# Patient Record
Sex: Male | Born: 1960 | Race: White | Hispanic: Yes | Smoking: Never smoker
Health system: Southern US, Community
[De-identification: ages and names within clinical notes are randomized; demographics above are authoritative.]

## PROBLEM LIST (undated history)

## (undated) DIAGNOSIS — F1010 Alcohol abuse, uncomplicated: Secondary | ICD-10-CM

---

## 2001-06-23 ENCOUNTER — Emergency Department (HOSPITAL_COMMUNITY): Admission: EM | Admit: 2001-06-23 | Discharge: 2001-06-23 | Payer: Self-pay | Admitting: Emergency Medicine

## 2003-09-15 ENCOUNTER — Inpatient Hospital Stay (HOSPITAL_COMMUNITY): Admission: EM | Admit: 2003-09-15 | Discharge: 2003-09-19 | Payer: Self-pay | Admitting: Psychiatry

## 2004-08-31 ENCOUNTER — Inpatient Hospital Stay (HOSPITAL_COMMUNITY): Admission: EM | Admit: 2004-08-31 | Discharge: 2004-09-02 | Payer: Self-pay | Admitting: Emergency Medicine

## 2004-08-31 ENCOUNTER — Ambulatory Visit: Payer: Self-pay | Admitting: Internal Medicine

## 2004-09-02 ENCOUNTER — Emergency Department (HOSPITAL_COMMUNITY): Admission: EM | Admit: 2004-09-02 | Discharge: 2004-09-03 | Payer: Self-pay | Admitting: Emergency Medicine

## 2006-12-28 ENCOUNTER — Emergency Department (HOSPITAL_COMMUNITY): Admission: EM | Admit: 2006-12-28 | Discharge: 2006-12-28 | Payer: Self-pay | Admitting: Emergency Medicine

## 2010-06-18 NOTE — Discharge Summary (Signed)
NAME:  Ricardo Koch, Ricardo Koch NO.:  [id number]   MEDICAL RECORD NO.:  [id number]          PATIENT TYPE:  INP   LOCATION:  3030                         FACILITY:  [facility]   PHYSICIAN:  Alvester Morin, M.D.  DATE OF BIRTH:  [birth year]   DATE OF ADMISSION:  08/31/2004  DATE OF DISCHARGE:  09/02/2004                                 DISCHARGE SUMMARY   DISCHARGE DIAGNOSES:  1.  Mallory-Weiss tear, status post esophagogastroduodenoscopy with      epinephrine injection.  2.  Thrombocytopenia.  3.  Hypokalemia.  4.  Hyponatremia.  5.  Alcohol abuse.  6.  Acute renal insufficiency.  7.  Hyperglycemia.   DISCHARGE MEDICATIONS:  1.  Protonix 40 mg b.i.d.  2.  Multivitamin one daily.   CONDITION ON DISCHARGE:  The patient has significantly improved without any  signs of active bleeding, hematemesis, or melena with stable CBC. The  patient is also symptomatically feeling much better.  The patient is to  follow up with Dr. Merlene Pulling on September 09, 2004, for followup CBC and fasting  glucose. The patient was advised to quit drinking. He is given an  appointment to [facility] with a Spanish speaking group. He is  advised that if he continues to have drink he may have a recurrence of  hematemesis with grave consequences. All discharge instructions and followup  were reviewed with the translator present.   PROCEDURE:  1.  On August 31, 2004, abdominal x-ra. Impression: Three large gallstones,      degenerative changes in left hip,  2.  EGD, on August 31, 2004, showed Mallory-Weiss tear at the      gastroesophageal junction with injection of epinephrine. No  peptic      ulcer disease or esophageal varices.   CONSULTATIONS:  GI consult: Dr. Herbert Moors.   ADMITTING HISTORY & PHYSICAL:  The patient is a 50 year old Hispanic male  with a history of chronic alcohol abuse, who presented to the emergency  department after approximately 15 hours of nausea and hematemesis. The  patient states that at 2 p.m. the day prior to admission he experienced  sudden onset of nausea and blood tinged emesis. Vomiting continued overnight  approximately six times with progressively larger amounts of blood. The  patient states that prior to this episode he had been well without  complaints. Subsequent to the episode he noticed the presence of tarry  stools. He denies abdominal pain, diarrhea, or constipation. The patient had  a similar episode approximately 11 years ago, but was  unaware of the  diagnosis and was unclear about what interventions were performed. He has an  extensive alcohol abuse history. He drinks approximately 10 drinks per day.  Vital signs on admission revealed temperature of 100.4, blood pressure  123/78, heart rate 136, respiratory rate 22, pulse oximetry 100% on room  air.  Orthostatics were followed: lying blood pressure 114/56 and heart rate  95; sitting blood pressure 124/53 with heart rate 99; standing blood  pressure 125/251 with heart rate 110.   PHYSICAL EXAMINATION:  GENERAL: Alert, in no acute distress.  PULMONARY: Breath sounds clear to auscultation bilaterally.  HEENT: PERRL. EOMI. Nonicteric. No oropharyngeal injection. No exudate. No  lymphadenopathy.  CV: S1 and S2 without murmurs, thrills, rubs, or gallops.  ABDOMEN: Soft, nontender, and nondistended. No masses. No  hepatosplenomegaly. Positive bowel sounds times four. No ascites.  EXTREMITIES: 2+ radial pulses bilaterally; 2+ dorsalis pedis pulses  bilaterally. No clubbing, cyanosis, or edema.  SKIN: No jaundice.  No varicosities.  PSYCHIATRIC: Euthymic with congruent affect.   PERTINENT LABORATORIES ON ADMISSION:  Lipase 26, alcohol less than 5.  White  count 5.9, hemoglobin 13.1, platelet count 113,000.  PT 18, PTT 18, INR 1.5.  Sodium 130, potassium 3.0, chloride 93, bicarbonate 28, BUN 18, creatinine  1.3, glucose 186. Anion gap 9. Bilirubin 2. Alkaline phosphatase 70, AST  167,  ALT 101, albumin 2.9. EGD revealed a bleeding Mallory-Weiss tear.   HOSPITAL COURSE:   PROBLEM #1:  Hematemesis.  The patient presented with bleeding Mallory-Weiss  tear which via EGD was injected with epinephrine. The patient had no  vomiting of blood in the hospital. He was transfused one unit of packed red  blood cells in the emergency department. The patient was subsequently  transfused two additional units while in stepdown which elevated his CBC  from hemoglobin of 7.7 to 9.3 (admission hemoglobin was 9.0). The patient's  hemoglobin subsequently remained stable during his hospital stay with no  evidence of additional bleeding by stool or emesis.   PROBLEM #2:  Hypokalemia/hyponatremia. Electrolyte abnormalities were  secondary to vomiting, dehydration, and alcohol abuse. The patient was  rehydrated with normal saline. Potassium was repleted with potassium  chloride elixir and subsequently remained stable throughout his hospital  stay.   PROBLEM #3:  Thrombocytopenia. The patient's platelets on admission were 83  and subsequently fell to 44.  He was transfused two units of FFP. The  patient's thrombocytopenia is believed to be secondary to alcohol abuse and  vitamin B12 and folate deficiency.   PROBLEM #4:  Alcohol abuse. The patient was advised several times of the  importance of drinking sensation. The patient verbalizes understanding and  has agreed to go to [employer] which we have provided with the  Spanish meeting group with instructions in Spanish with the meeting place  and time.   PROBLEM #5:  Hyperglycemia. The patient's blood glucose was elevated during  his hospital stay with fasting blood glucose ranging from 121 to 186. The  patient's hemoglobin A1C came back to 6.0. We will obtain one additional  blood sugar during his follow-up appointment at clinic and we will start a diet regimen and possible medical management as well as arrange long-term  diabetic  follow-up.   PROBLEM #6:  Acute renal insufficiency. The patient's acute renal  insufficiency is most likely due to his dehydration. Serum creatinine was  corrected following progressive rehydration.   DISCHARGE LABORATORIES AND VITAL SIGNS:  The patient's last set of labs on  the day of discharge were as follows: Sodium 138, potassium 3.4, chloride  106, CO2 26, BUN 5, creatinine 1, glucose 121, calcium 7.4. CBC showed a  white count of 5.4, hemoglobin 9.1, platelet count 57,000, MCV 95.3, and no  pending labs.      Wil   WC/MEDQ  D:  09/04/2004  T:  09/05/2004  Job:  [id number]   cc:   Graylin Shiver, M.D.  [address]  [address]  [place], [place] [address]  Fax: [phone]

## 2010-06-18 NOTE — Op Note (Signed)
NAME:  Ricardo Koch, Ricardo Koch NO.:  [id number]   MEDICAL RECORD NO.:  [id number]          PATIENT TYPE:  EMS   LOCATION:  MAJO                         FACILITY:  [facility]   PHYSICIAN:  Graylin Shiver, M.D.   DATE OF BIRTH:  May 13, 1960   DATE OF PROCEDURE:  08/31/2004  DATE OF DISCHARGE:                                 OPERATIVE REPORT   Esophagogastroduodenoscopy with injection of a bleeding MalloryWeiss tear.   INDICATIONS:  Upper GI bleed.   Informed consent was obtained after explanation of the risks of bleeding,  infection and perforation.   PREMEDICATION:  Fentanyl 100 mcg IV, Versed 10 milligrams IV, Phenergan 12.5  milligrams IV.   PROCEDURE:  With the patient in the left lateral decubitus position, the  Olympus gastroscope was inserted into the oropharynx and passed into the  esophagus. It was advanced down the esophagus and into the stomach and into  the duodenum. The second portion and bulb of the duodenum looked normal. The  stomach did not reveal any lesions in the antrum or body.  There was some  blood clot noted in the body of the stomach.  The scope was retroflexed. No  specific lesions were seen in the fundus or cardia.  I could see some blood  dripping from that area.  The scope was then straightened and brought back.  There was a Mallory Weiss tear noted just distal to the EG junction. This  area was oozing blood. It was injected with 3 cc of 1:10,000 epinephrine  solution.  The esophagus did not reveal any esophageal varices. The patient  was a very uncooperative during the exam, was fighting with [place] which made  the exam very difficult due to his uncooperative nature.   IMPRESSION:  Bleeding Mallory Weiss tear injected with 3 cc of 1:10,000  epinephrine.   PLAN:  I will give the patient vitamin K, fresh frozen plasma. His H&H's  will be followed. He will be on Protonix 40 milligrams IV q.12 hours.       SFG/MEDQ  D:  08/31/2004  T:  09/01/2004   Job:  [id number]   cc:   Alvester Morin, M.D.  [address]  [place]  [place] [address]  Fax: [phone]

## 2010-06-18 NOTE — Consult Note (Signed)
NAME:  Ricardo Koch, Ricardo Koch NO.:  [id number]   MEDICAL RECORD NO.:  [id number]          PATIENT TYPE:  EMS   LOCATION:  MAJO                         FACILITY:  [facility]   PHYSICIAN:  Graylin Shiver, M.D.   DATE OF BIRTH:  Nov 28, 1960   DATE OF CONSULTATION:  08/31/2004  DATE OF DISCHARGE:                                   CONSULTATION   REASON FOR CONSULTATION:  The patient is a 50 year old [place] male who  presented to the emergency room with a history of vomiting and hematemesis.  He started vomiting yesterday of at 2:00 p.m. and continued to vomit  throughout the night with progressively more hematemesis. He is not  complaining of abdominal pain or chest pain. He does have a history of  drinking excessive alcohol, usually drinks six or seven beers a day during  the week and more on the weekend. He does not take aspirin or nonsteroidal  anti-inflammatory drugs. He did have a dark stool yesterday.   PAST HISTORY:   MEDICAL PROBLEMS:  None stated.   ALLERGIES:  None known.   MEDICATIONS:  None.   PAST SURGICAL HISTORY:  None   SOCIAL HISTORY:  Does not smoke. Drinks alcohol as stated above.   REVIEW OF SYSTEMS:  No complaints of anginal chest pain, shortness of  breath, cough or sputum production,   PHYSICAL EXAMINATION:  GENERAL:  He does not appear any acute distress.  VITAL SIGNS:  Blood pressure is 123/78, pulse 136.  HEENT:  He is nonicteric. He is alert and oriented.  NECK:  Supple.  HEART:  Regular rhythm. No murmurs.  LUNGS:  Lungs were clear.  ABDOMEN:  Abdomen is soft, nontender, no hepatosplenomegaly.   Hemoglobin is 9. INR is 1.5. Prothrombin time 18.4.  There is some mild  elevation of transaminases   IMPRESSION:  Hematemesis in a 50 year old male with excessive alcohol use.  Rule out peptic ulcer disease, rule out Mallory Weiss tear, rule out  gastritis, rule out esophageal varices.   PLAN:  Proceed with EGD. The patient is going to receive  blood transfusion.  He will be placed on a PPI in the form of Protonix 40 milligrams IV q.12h.  Should his endoscopy reveal varices, I will start him on octreotide. I have  also talked to him about if he has varices and nothing else is found, the  potential of proceeding with variceal band ligation. The patient understands  and consents to procedure.       SFG/MEDQ  D:  08/31/2004  T:  08/31/2004  Job:  [id number]   cc:   Alvester Morin, M.D.  [address]  [place]  [place] [address]  Fax: [phone]

## 2010-06-18 NOTE — Discharge Summary (Signed)
NAME:  Ricardo Koch, MOHAMMAD NO.:  [id number]   MEDICAL RECORD NO.:  [id number]                   PATIENT TYPE:  IPS   LOCATION:  0302                                 [facility]   PHYSICIAN:  Geoffery Lyons, M.D.                   DATE OF BIRTH:  [birth year]   DATE OF ADMISSION:  09/15/2003  DATE OF DISCHARGE:  09/19/2003                                 DISCHARGE SUMMARY   CHIEF COMPLAINT AND PRESENT ILLNESS:  This was the first admission to [facility] for this 50 year old divorced, Hispanic male  voluntarily admitted.  History of alcohol abuse.  For two years, the patient  has been drinking every day, up to 30 beers per day.  When he gets up at  night, he also is doing some drinking.  Long history of alcohol dependence.  Longest history of sobriety has been one year.  Tried to stop on his own the  Friday before this admission.  Was having problems shaking and itching.  Went to the emergency department.  He was in acute withdrawal and inpatient  stay was recommended.   PAST PSYCHIATRIC HISTORY:  First time at [facility].  Was in [facility] in  2003.   SUBSTANCE ABUSE HISTORY:  As already stated, drinking beer all day long,  wakes up in the night and drinks some more.  Denies any other substance use.   MEDICAL HISTORY:  Noncontributory.   MEDICATIONS:  None.   PHYSICAL EXAMINATION:  Performed and failed to show any acute findings.   LABORATORY DATA:  CBC with hemoglobin 13.1, hematocrit 38.2.  Blood  chemistries with sodium 134, repeated to 139.  Glucose 151, repeated at 135.  SGOT 95, repeated to 58.  SGPT 48, repeated went down to 40.  TSH 6.659 but  free T4 was 0.97, free T3 3.5.   MENTAL STATUS EXAM:  Alert male.  Cooperative.  Good eye contact.  Speech  was clear.  Stated that he was starting to feel better.  Pleasant and  agreeable to the situation.  Thought processes are logical, coherent and  relevant.  Affect was broad.   Some insight in terms of his alcohol use and  his needs to abstain the physical and social consequences of his drinking.  Denied any active suicidal or homicidal ideation.  There was no evidence of  delusions.  There was no evidence of hallucinations.  Cognition was well-  preserved.   ADMISSION DIAGNOSES:   AXIS I:  1.  Alcohol dependence.  2.  Alcohol withdrawal.   AXIS II:  No diagnosis.   AXIS III:  No diagnosis.   AXIS IV:  Moderate.   AXIS V:  Global Assessment of Functioning upon admission 35-40; highest  Global Assessment of Functioning in the last year 65.   HOSPITAL COURSE:  He was admitted and started in individual and  group  psychotherapy.  He was detoxified with Librium.  He was given some Benadryl  for itching.  He was given Gatorade and trazodone for sleep that he did not  use.  The detoxification continued uneventfully.  Endorsed that he was  committed to abstaining.  Endorsed increased tolerance and withdrawal.  Claimed that he got really scared when he started jerking and he could not  control it.  He was aware of the effect of alcohol on the liver and the  changes in his liver enzymes.  As he continued to detox, his mood was  euthymic.  His affect was bright, broad.  There was some tiredness but he  recovered as suspected.  On August 19th, he was in full contact with  reality.  Fully detoxed.  Mood euthymic.  Affect bright, broad.  No suicidal  or homicidal ideation.  Increased insight.  Relapse prevention plan.  Wanting to go to [facility] and pursue further outpatient treatment.   DISCHARGE DIAGNOSES:   AXIS I:  1.  Alcohol dependence.  2.  Status post alcohol withdrawal.   AXIS II:  No diagnosis.   AXIS III:  No diagnosis.   AXIS IV:  Moderate.   AXIS V:  Global Assessment of Functioning upon discharge 55.   DISCHARGE MEDICATIONS:  None.   FOLLOW UP:  To pursue treatment through [facility] and [facility].                                                Geoffery Lyons, M.D.    IL/MEDQ  D:  10/13/2003  T:  10/14/2003  Job:  [id number]

## 2010-06-18 NOTE — H&P (Signed)
NAME:  Ricardo Koch, FYOCK NO.:  [id number]   MEDICAL RECORD NO.:  [id number]                   PATIENT TYPE:  IPS   LOCATION:  0302                                 F[facility]ILITY[facility]  [facility]   PHYSICIAN:  Geoffery Lyons, M.D.                   DATE OF BIRTH:  December 25, 1960   DATE OF ADMISSION:  09/15/2003  DATE OF DISCHARGE:                         PSYCHIATRIC ADMISSION ASSESSMENT   IDENTIFYING INFORMATION:  The patient is a 50 year old divorced Hispanic  male voluntarily admitted on September 15, 2003.   HISTORY OF PRESENT ILLNESS:  The patient presents with a history of alcohol  abuse.  For the past two years the patient has been drinking every day,  drinking up to 30 beers per day.  The patient is reporting when he gets up  at night he is also doing some drinking.  He has a long history of drinking.  His longest history of sobriety has been one year.  The patient states he  tried to stop on his own on Friday before this admission and was having  problems with shaking and itching and went to the emergency department.  He  denies any suicidal or homicidal thoughts and states that once he has one  beer he cannot stop.   PAST PSYCHIATRIC HISTORY:  This is the first admission to [facility].  He was in [facility] in 2003.  Longest history of sobriety has been one  year.   SUBSTANCE ABUSE HISTORY:  He is a nonsmoker.  He drinks beer.  He denies any  drug use.   PAST MEDICAL HISTORY:  Primary care Hasson Gaspard: Unknown.  Medical problems:  None.   MEDICATIONS:  None.   DRUG ALLERGIES:  No known allergies.   PHYSICAL EXAMINATION:  GENERAL:  The patient was assessed at [facility].  The patient is in no acute distress.  No tremors were noted at this time.  VITAL SIGNS:  Temperature 97.1, heart rate 54, respirations 18, blood  pressure 115/68.  He is 5 feet 5 inches tall, 149 pounds.   LABORATORY DATA:  Glucose 102.  Alcohol level was less than 5.  Urine drug  screen  was negative.   SOCIAL HISTORY:  He is a 50 year old divorced Hispanic male with three  children ages 40, 1, and 61.  He is currently living with friends.  He does  Holiday representative work.  He has been in the [place] for about 20 years.  He  has no DUIs.  He reports he does not drive.   FAMILY HISTORY:  Brother with alcohol, also with grandfather and his uncles  all drink.   MENTAL STATUS EXAM:  He is an alert, middle-aged male, cooperative, good eye  contact.  Speech is clear.  The patient feels good.  He is pleasant and  agreeable to situation.  Thought processes are coherent.  Cognitive  functioning: Intact.  Memory is good.  Judgment and insight are fair.  Poor  impulse control.   ADMISSION DIAGNOSES:   AXIS I:  Alcohol dependence.   AXIS II:  Deferred.   AXIS III:  None.   AXIS IV:  Problems related to alcohol use.   AXIS V:  Current is 40, this past year is 86.   INITIAL PLAN OF CARE:  Plan is detoxify the patient safely, work on relapse  prevention.  Will encourage fluids.  The patient is to attend groups.  Case  manager is to look at any rehabilitation programs available to the patient.  The patient is to remain alcohol free.   ESTIMATED LENGTH OF STAY:  Three to four days.     Landry Corporal, N.P.                       Geoffery Lyons, M.D.    JO/MEDQ  D:  09/16/2003  T:  09/16/2003  Job:  [id number]

## 2011-06-06 ENCOUNTER — Encounter (HOSPITAL_COMMUNITY): Payer: Self-pay | Admitting: Emergency Medicine

## 2011-06-06 ENCOUNTER — Emergency Department (HOSPITAL_COMMUNITY): Payer: Self-pay

## 2011-06-06 ENCOUNTER — Emergency Department (HOSPITAL_COMMUNITY)
Admission: EM | Admit: 2011-06-06 | Discharge: 2011-06-06 | Disposition: A | Discharge status: Home / Self Care | Payer: Self-pay | Attending: Emergency Medicine | Admitting: Emergency Medicine

## 2011-06-06 DIAGNOSIS — W010XXA Fall on same level from slipping, tripping and stumbling without subsequent striking against object, initial encounter: Secondary | ICD-10-CM | POA: Insufficient documentation

## 2011-06-06 DIAGNOSIS — S52599A Other fractures of lower end of unspecified radius, initial encounter for closed fracture: Secondary | ICD-10-CM | POA: Insufficient documentation

## 2011-06-06 DIAGNOSIS — R29898 Other symptoms and signs involving the musculoskeletal system: Secondary | ICD-10-CM | POA: Insufficient documentation

## 2011-06-06 DIAGNOSIS — Y990 Civilian activity done for income or pay: Secondary | ICD-10-CM | POA: Insufficient documentation

## 2011-06-06 DIAGNOSIS — R609 Edema, unspecified: Secondary | ICD-10-CM | POA: Insufficient documentation

## 2011-06-06 DIAGNOSIS — S52502A Unspecified fracture of the lower end of left radius, initial encounter for closed fracture: Secondary | ICD-10-CM

## 2011-06-06 DIAGNOSIS — M25439 Effusion, unspecified wrist: Secondary | ICD-10-CM | POA: Insufficient documentation

## 2011-06-06 DIAGNOSIS — M25539 Pain in unspecified wrist: Secondary | ICD-10-CM | POA: Insufficient documentation

## 2011-06-06 MED ORDER — IBUPROFEN 800 MG PO TABS
800.0000 mg | ORAL_TABLET | Freq: Once | ORAL | Status: AC
  Administered 2011-06-06: 800 mg via ORAL
  Filled 2011-06-06: qty 1

## 2011-06-06 NOTE — Discharge Instructions (Signed)
You should take the ibuprofen three times a day to help reduce inflammation and pain. Take this medication with food. Do not take any other anti-inflammatory medication with this, including aleve, advil, and naproxen.   Apply ice to your injured areas for 15-20 minutes three times a day for the next several days to help reduce swelling and inflammation.     Bonnita Levan de radio (Radial Fracture) Usted ha sufrido una quebradura de hueso (fractura) en el antebrazo. Es la parte del brazo que se encuentra entre el codo y la [place]. El Product manager est compuesto por [employer]. Ellos son el radio y Conservator, museum/gallery. Su fractura est en el hueso radio. Es el que est ubicado en el antebrazo del lado del pulgar. Se utiliza un yeso o una tablilla para proteger y Pharmacologist el hueso lesionado inmvil. En general el yeso o la tablilla se dejan durante 5 a 6 semanas pero vara segn cada persona. INSTRUCCIONES PARA EL CUIDADO DOMICILIARIO  Mantenga la zona lesionada elevada, mientras est sentado o recostado. Mantenga la lesin por encima del nivel del corazn (el centro del pecho). Esto disminuir la hinchazn y Chief Technology Officer.   Aplique hielo sobre la lesin durante 15 a 20 minutos 3 a 4 veces por [employer] se encuentre despierto, durante 2 das. Coloque el hielo en una bolsa plstica y ponga una toalla delgada entre la bolsa y el yeso o tablilla.   Mantenga los dedos en movimiento para evitar la rigidez y reducir la inflamacin.   Si le colocaron un yeso o un molde de [place] de vidrio:   No trate de rascarse la piel por debajo del molde utilizando objetos filosos o puntiagudos.   Controle todos los [employer] piel de alrededor del yeso. Puede colocarse una locin en las zonas rojas o doloridas.   Mantenga el yeso seco y limpio.   Si tiene una tablilla de yeso:   sela del modo en que se lo indicaron.   Puede aflojar el elstico que rodea la tablilla si los dedos se entumecen, siente hormigueos, se enfran o se  vuelven de color azul.   No haga presin en ninguna parte de la tablilla. Podra romperse. Durante las primeras 24 horas mantenga el yeso sobre una almohada hasta que est completamente duro.   Puede proteger el yeso o la tablilla durante el bao con una bolsa plstica. No los sumerja en el agua.   Utilice los medicamentos de venta libre o de prescripcin para Chief Technology Officer, Environmental health practitioner o la [place], segn se lo indique el profesional que lo asiste.  SOLICITE ATENCIN MDICA DE INMEDIATO SI:  El yeso se daa o se rompe.   Siente un dolor fuerte y continuo o est ms hinchado que antes de colocarle el yeso.   Aumenta el dolor o tiene dificultad para estirar los dedos.   Hay olor feo o aparecen nuevas manchas o un drenaje similar al pus (purulento) por debajo del yeso.   Los dedos o la mano se vuelven plidos o azulados y fros o pierden sensibilidad.  Document Released: 04/26/2007 Document Revised: 01/06/2011 [facility] Patient Information 2012 [place], [place].

## 2011-06-06 NOTE — Progress Notes (Signed)
Orthopedic Tech Progress Note Patient Details:  Ricardo Koch [birth year] [id number]  Type of Splint: Sugartong;Other (comment) (foam arm sling) Splint Location: left arm Splint Interventions: Application    Ricardo Koch 06/06/2011, 1:32 PM

## 2011-06-06 NOTE — ED Provider Notes (Signed)
Medical screening examination/treatment/procedure(s) were performed by non-physician practitioner and as supervising physician I was immediately available for consultation/collaboration.   Dione Booze, MD 06/06/11 [phone]

## 2011-06-06 NOTE — ED Notes (Signed)
Patient transported to X-ray 

## 2011-06-06 NOTE — ED Provider Notes (Signed)
History     CSN: [id number]  Arrival date & time 06/06/11  0913   First MD Initiated Contact with Patient 06/06/11 0945      Chief Complaint  Patient presents with  . Wrist Pain    (Consider location/radiation/quality/duration/timing/severity/associated sxs/prior treatment) The history is provided by the patient.  51 y/o M with no PMH presents to ED with c/c of left wrist pain after a FOOSH injury when he slipped and fall at work on Saturday (2 days ago). Pt is RHD. Pain throbbing, constant, mod intensity. Worse with attempted movement, palpation of injured area. (+) swelling, decreased ROM, weakness to the wrist. Neg wound, numbness, pain radiation. No prior tx.  History reviewed. No pertinent past medical history.  History reviewed. No pertinent past surgical history.  History reviewed. No pertinent family history.  History  Substance Use Topics  . Smoking status: Never Smoker   . Smokeless tobacco: Not on file  . Alcohol Use: Yes      Review of Systems  Constitutional: Negative for fever and chills.  Musculoskeletal: Positive for joint swelling.  Skin: Negative for color change and wound.  Neurological: Positive for weakness. Negative for numbness.    Allergies  Review of patient's allergies indicates no known allergies.  Home Medications  No current outpatient prescriptions on file.  BP 148/82  Pulse 67  Temp(Src) 97.9 F (36.6 C) (Oral)  Resp 20  SpO2 98%  Physical Exam  Nursing note and vitals reviewed. Constitutional: He appears well-developed and well-nourished. No distress.  HENT:  Head: Normocephalic and atraumatic.  Cardiovascular: Normal rate.   Pulmonary/Chest: Effort normal. No respiratory distress.  Musculoskeletal: He exhibits edema and tenderness.       Left wrist: He exhibits decreased range of motion, tenderness (pain over radial styloid process, anatomical snuffbox), bony tenderness and swelling. He exhibits no laceration.       MSK  exam otherwise with TTP, edema, deformity. Brisk capillary refill to all digits of affected hand with full ROM without pain to all digits.  Neurological: He is alert.       Sensation intact to light touch and symmetric in BLE distally  Skin: Skin is warm and dry.       No abrasion, laceration, or ecchymosis to injured area    ED Course  Procedures (including critical care time)  Labs Reviewed - No data to display Dg Wrist Complete Left  06/06/2011  *RADIOLOGY REPORT*  Clinical Data: Fall.  Wrist pain.  LEFT WRIST - COMPLETE 3+ VIEW  Comparison: None.  Findings: The left wrist is located. There is a focal irregularity along the dorsal aspect of the distal radius.  This may be degenerative but an avulsion fracture is not excluded.  Please correlate with point tenderness.  The wrist is otherwise unremarkable.  IMPRESSION:  1.  Possible avulsion fracture along the dorsal aspect of the distal radius.  Please correlate with point tenderness. 2.  The wrist is otherwise intact.  Original Report Authenticated By: Jamesetta Orleans. MATTERN, M.D.     Dx 1: Distal radius fx   MDM  Wrist pain after FOOSH injury. I personally viewed the x-ray images, reviewed radiologist reading. Avulsion fx to dorsal distal radius. Clinical exam also raises suspicion for scaphoid fx with anatomical snuffbox TTP. Pt placed in short arm cast and instructed to f/u with orthopedics for recheck.        Shaaron Adler, [place] 06/06/11 [phone]

## 2011-06-06 NOTE — ED Notes (Signed)
Pt c/o left wrist pain after falling with trailor on sat; pt sts still painful and mildly swollen; CMS intact

## 2013-06-16 IMAGING — CR DG WRIST COMPLETE 3+V*L*
4 series · 4 of 4 positions shown · non-contrast
Comparison: None.

CLINICAL DATA: Fall.  Wrist pain.

LEFT WRIST - COMPLETE 3+ VIEW

[x wrist pa left]
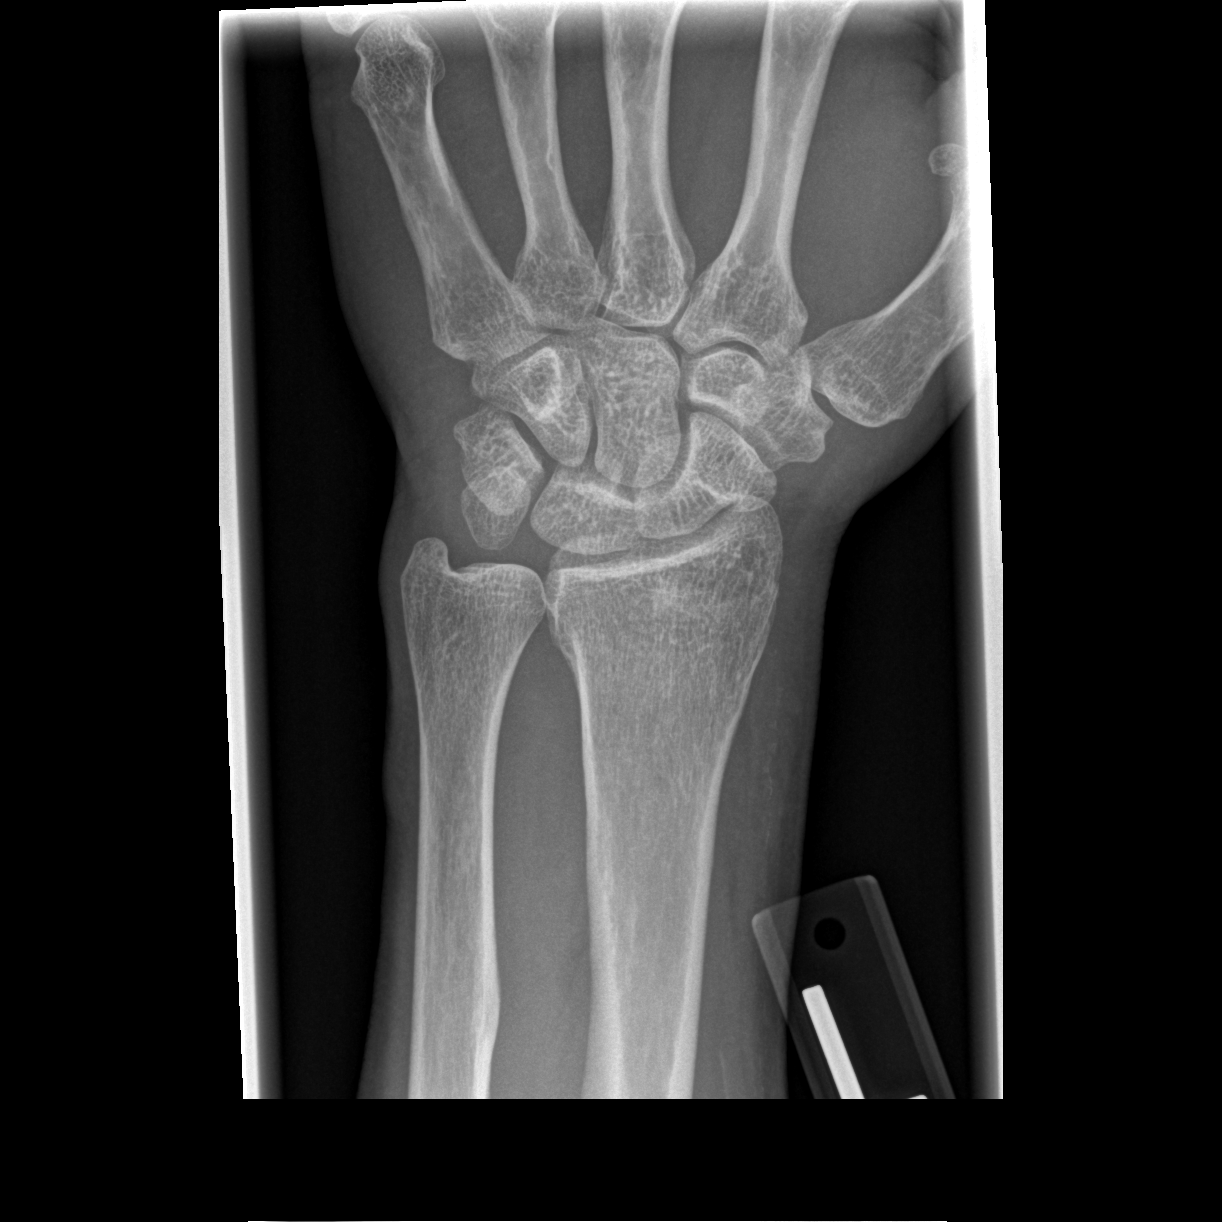

[x wrist obl left]
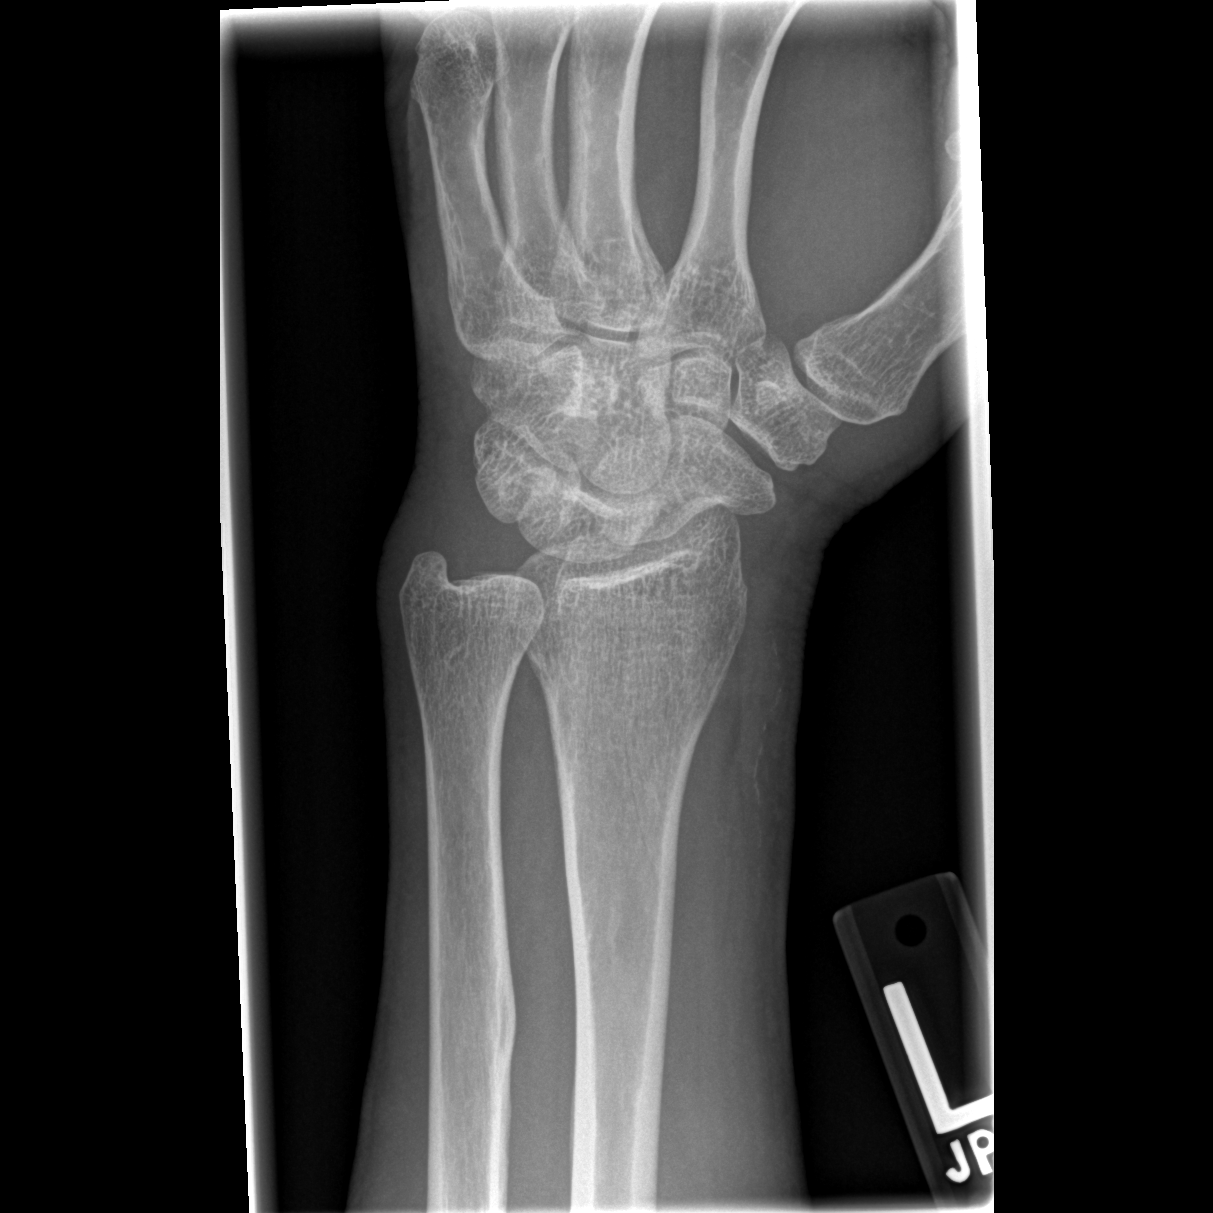

[x wrist lat left]
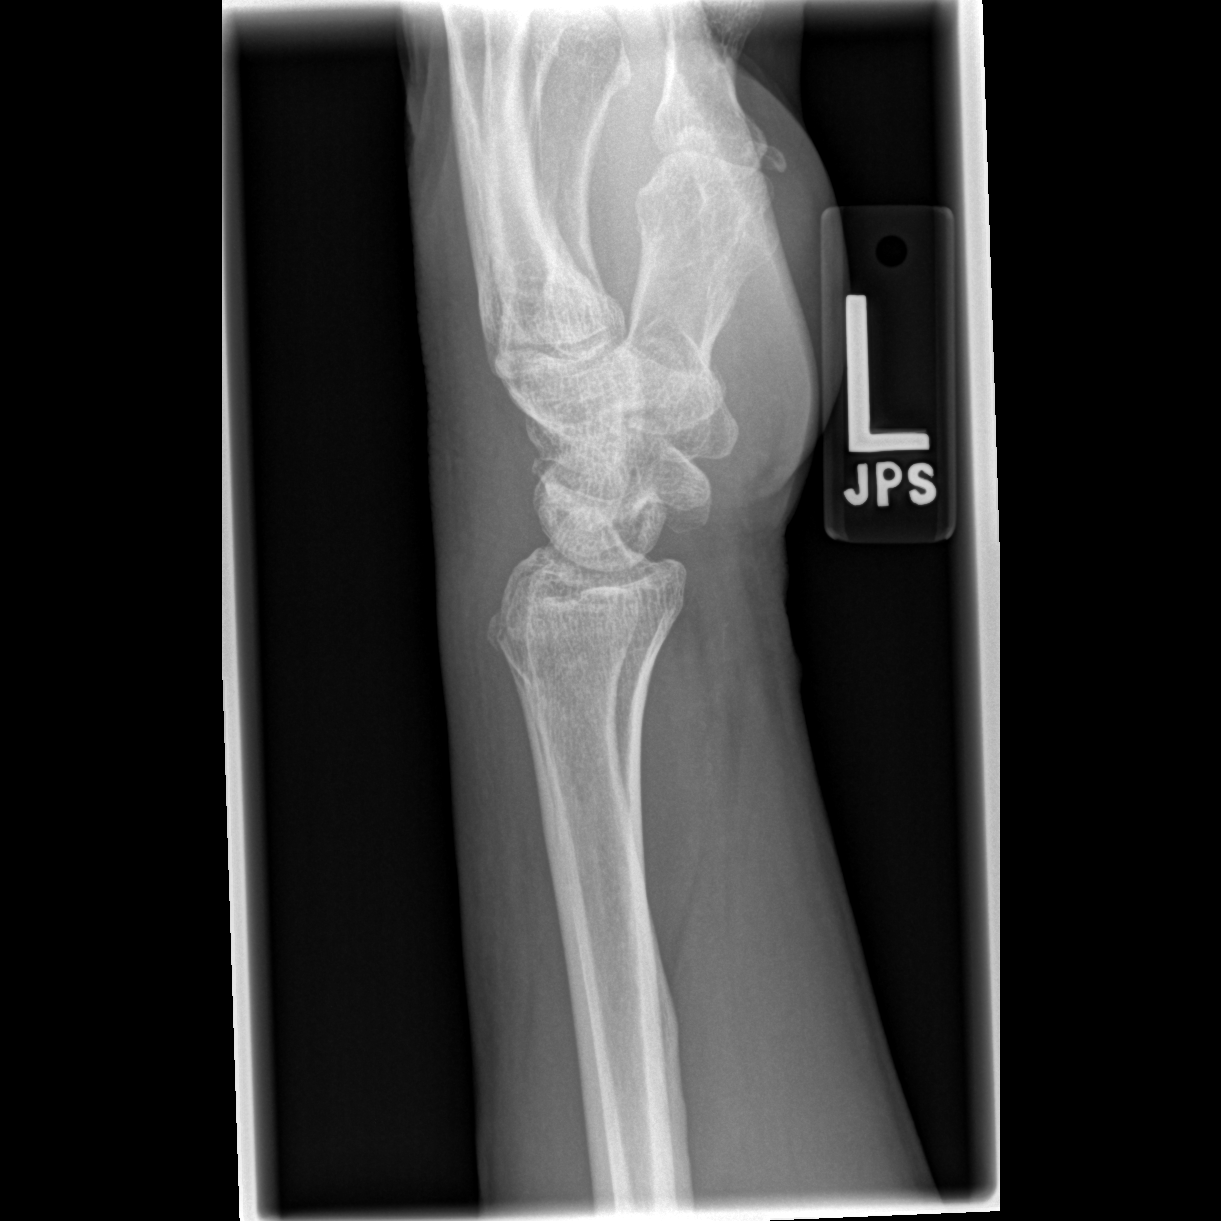

[x navicular]
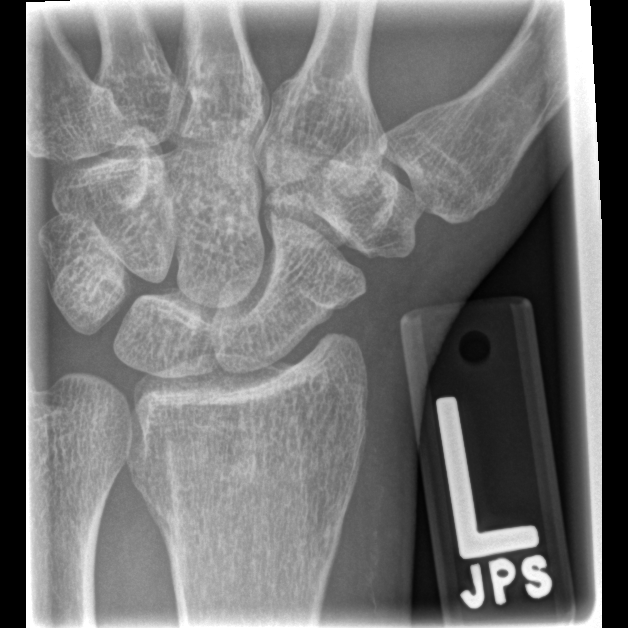

[4 of 4 positions shown; findings below may reference images not displayed]

FINDINGS: The left wrist is located. There is a focal irregularity
along the dorsal aspect of the distal radius.  This may be
degenerative but an avulsion fracture is not excluded.  Please
correlate with point tenderness.  The wrist is otherwise
unremarkable.
IMPRESSION: 1.  Possible avulsion fracture along the dorsal aspect of the
distal radius.  Please correlate with point tenderness.
2.  The wrist is otherwise intact.

## 2015-05-09 ENCOUNTER — Emergency Department (HOSPITAL_COMMUNITY)

## 2015-05-09 ENCOUNTER — Encounter (HOSPITAL_COMMUNITY): Payer: Self-pay | Admitting: Emergency Medicine

## 2015-05-09 ENCOUNTER — Inpatient Hospital Stay (HOSPITAL_COMMUNITY)

## 2015-05-09 ENCOUNTER — Inpatient Hospital Stay (HOSPITAL_COMMUNITY)
Admission: EM | Admit: 2015-05-09 | Discharge: 2015-06-01 | DRG: 441 | Disposition: E | Attending: Pulmonary Disease | Admitting: Pulmonary Disease

## 2015-05-09 DIAGNOSIS — A419 Sepsis, unspecified organism: Secondary | ICD-10-CM | POA: Diagnosis present

## 2015-05-09 DIAGNOSIS — D689 Coagulation defect, unspecified: Secondary | ICD-10-CM | POA: Diagnosis present

## 2015-05-09 DIAGNOSIS — G9340 Encephalopathy, unspecified: Secondary | ICD-10-CM | POA: Diagnosis present

## 2015-05-09 DIAGNOSIS — K7290 Hepatic failure, unspecified without coma: Secondary | ICD-10-CM | POA: Diagnosis present

## 2015-05-09 DIAGNOSIS — I959 Hypotension, unspecified: Secondary | ICD-10-CM | POA: Diagnosis present

## 2015-05-09 DIAGNOSIS — E872 Acidosis: Secondary | ICD-10-CM | POA: Diagnosis present

## 2015-05-09 DIAGNOSIS — R7989 Other specified abnormal findings of blood chemistry: Secondary | ICD-10-CM | POA: Diagnosis present

## 2015-05-09 DIAGNOSIS — N390 Urinary tract infection, site not specified: Secondary | ICD-10-CM | POA: Diagnosis present

## 2015-05-09 DIAGNOSIS — K922 Gastrointestinal hemorrhage, unspecified: Secondary | ICD-10-CM | POA: Diagnosis present

## 2015-05-09 DIAGNOSIS — R6521 Severe sepsis with septic shock: Secondary | ICD-10-CM | POA: Diagnosis present

## 2015-05-09 DIAGNOSIS — N19 Unspecified kidney failure: Secondary | ICD-10-CM | POA: Diagnosis not present

## 2015-05-09 DIAGNOSIS — J9601 Acute respiratory failure with hypoxia: Secondary | ICD-10-CM | POA: Diagnosis present

## 2015-05-09 DIAGNOSIS — R627 Adult failure to thrive: Secondary | ICD-10-CM | POA: Diagnosis present

## 2015-05-09 DIAGNOSIS — N179 Acute kidney failure, unspecified: Secondary | ICD-10-CM | POA: Diagnosis present

## 2015-05-09 DIAGNOSIS — B49 Unspecified mycosis: Secondary | ICD-10-CM | POA: Diagnosis present

## 2015-05-09 DIAGNOSIS — R58 Hemorrhage, not elsewhere classified: Secondary | ICD-10-CM

## 2015-05-09 DIAGNOSIS — Z66 Do not resuscitate: Secondary | ICD-10-CM | POA: Diagnosis present

## 2015-05-09 DIAGNOSIS — K920 Hematemesis: Secondary | ICD-10-CM | POA: Diagnosis present

## 2015-05-09 DIAGNOSIS — K767 Hepatorenal syndrome: Secondary | ICD-10-CM | POA: Diagnosis present

## 2015-05-09 DIAGNOSIS — E871 Hypo-osmolality and hyponatremia: Secondary | ICD-10-CM | POA: Diagnosis present

## 2015-05-09 DIAGNOSIS — K7030 Alcoholic cirrhosis of liver without ascites: Secondary | ICD-10-CM | POA: Diagnosis present

## 2015-05-09 DIAGNOSIS — K769 Liver disease, unspecified: Secondary | ICD-10-CM

## 2015-05-09 DIAGNOSIS — F1010 Alcohol abuse, uncomplicated: Secondary | ICD-10-CM

## 2015-05-09 DIAGNOSIS — I9589 Other hypotension: Secondary | ICD-10-CM

## 2015-05-09 DIAGNOSIS — K7682 Hepatic encephalopathy: Secondary | ICD-10-CM

## 2015-05-09 HISTORY — DX: Alcohol abuse, uncomplicated: F10.10

## 2015-05-09 LAB — CBC WITH DIFFERENTIAL/PLATELET
BAND NEUTROPHILS: 26 %
BASOS ABS: 0 10*3/uL (ref 0.0–0.1)
BASOS PCT: 0 %
Blasts: 0 %
EOS ABS: 0 10*3/uL (ref 0.0–0.7)
Eosinophils Relative: 0 %
HCT: 23.9 % — ABNORMAL LOW (ref 39.0–52.0)
Hemoglobin: 8.6 g/dL — ABNORMAL LOW (ref 13.0–17.0)
LYMPHS PCT: 17 %
Lymphs Abs: 3.9 10*3/uL (ref 0.7–4.0)
MCH: 31.7 pg (ref 26.0–34.0)
MCHC: 36 g/dL (ref 30.0–36.0)
MCV: 88.2 fL (ref 78.0–100.0)
METAMYELOCYTES PCT: 0 %
MONO ABS: 2.3 10*3/uL — AB (ref 0.1–1.0)
Monocytes Relative: 10 %
Myelocytes: 0 %
NEUTROS ABS: 16.8 10*3/uL — AB (ref 1.7–7.7)
Neutrophils Relative %: 47 %
OTHER: 0 %
PROMYELOCYTES ABS: 0 %
Platelets: 25 10*3/uL — CL (ref 150–400)
RBC: 2.71 MIL/uL — ABNORMAL LOW (ref 4.22–5.81)
RDW: 15.2 % (ref 11.5–15.5)
WBC MORPHOLOGY: INCREASED
WBC: 23 10*3/uL — ABNORMAL HIGH (ref 4.0–10.5)
nRBC: 0 /100 WBC

## 2015-05-09 LAB — BLOOD GAS, ARTERIAL
ACID-BASE DEFICIT: 16.9 mmol/L — AB (ref 0.0–2.0)
ACID-BASE DEFICIT: 21.3 mmol/L — AB (ref 0.0–2.0)
ACID-BASE DEFICIT: 21.9 mmol/L — AB (ref 0.0–2.0)
Acid-base deficit: 17.3 mmol/L — ABNORMAL HIGH (ref 0.0–2.0)
BICARBONATE: 11.8 meq/L — AB (ref 20.0–24.0)
BICARBONATE: 6.6 meq/L — AB (ref 20.0–24.0)
BICARBONATE: 9.2 meq/L — AB (ref 20.0–24.0)
Bicarbonate: 7.4 mEq/L — ABNORMAL LOW (ref 20.0–24.0)
Drawn by: 257701
Drawn by: 276051
Drawn by: 276051
Drawn by: 422461
FIO2: 1
FIO2: 0.4
FIO2: 0.5
FIO2: 0.5
LHR: 18 {breaths}/min
LHR: 26 {breaths}/min
MECHVT: 480 mL
MECHVT: 500 mL
O2 SAT: 89 %
O2 SAT: 88.7 %
O2 Saturation: 98.4 %
O2 Saturation: 86.9 %
PATIENT TEMPERATURE: 37
PATIENT TEMPERATURE: 35.4
PATIENT TEMPERATURE: 37.1
PCO2 ART: 24.4 mmHg — AB (ref 35.0–45.0)
PCO2 ART: 24.6 mmHg — AB (ref 35.0–45.0)
PEEP/CPAP: 5 cmH2O
PEEP/CPAP: 5 cmH2O
PEEP/CPAP: 5 cmH2O
PEEP: 5 cmH2O
PH ART: 7.101 — AB (ref 7.350–7.450)
PH ART: 7.076 — AB (ref 7.350–7.450)
PH ART: 7.2 — AB (ref 7.350–7.450)
PO2 ART: 83.3 mmHg (ref 80.0–100.0)
PO2 ART: 74 mmHg — AB (ref 80.0–100.0)
Patient temperature: 37
RATE: 26 resp/min
RATE: 28 resp/min
TCO2: 11.7 mmol/L (ref 0–100)
TCO2: 7.5 mmol/L (ref 0–100)
TCO2: 6.8 mmol/L (ref 0–100)
TCO2: 9.2 mmol/L (ref 0–100)
VT: 500 mL
VT: 650 mL
pCO2 arterial: 39.7 mmHg (ref 35.0–45.0)
pCO2 arterial: 23.5 mmHg — ABNORMAL LOW (ref 35.0–45.0)
pH, Arterial: 7.097 — CL (ref 7.350–7.450)
pO2, Arterial: 293 mmHg — ABNORMAL HIGH (ref 80.0–100.0)
pO2, Arterial: 86.1 mmHg (ref 80.0–100.0)

## 2015-05-09 LAB — AMMONIA: AMMONIA: 182 umol/L — AB (ref 9–35)

## 2015-05-09 LAB — LIPASE, BLOOD: Lipase: 98 U/L — ABNORMAL HIGH (ref 11–51)

## 2015-05-09 LAB — URINALYSIS, ROUTINE W REFLEX MICROSCOPIC
GLUCOSE, UA: NEGATIVE mg/dL
Ketones, ur: NEGATIVE mg/dL
Nitrite: POSITIVE — AB
PH: 5.5 (ref 5.0–8.0)
Protein, ur: 100 mg/dL — AB
SPECIFIC GRAVITY, URINE: 1.018 (ref 1.005–1.030)

## 2015-05-09 LAB — I-STAT CHEM 8, ED
BUN: >140 mg/dL — ABNORMAL HIGH (ref 6–20)
CHLORIDE: 93 mmol/L — AB (ref 101–111)
CREATININE: 8.6 mg/dL — AB (ref 0.61–1.24)
Calcium, Ion: 0.85 mmol/L — ABNORMAL LOW (ref 1.12–1.23)
Glucose, Bld: 115 mg/dL — ABNORMAL HIGH (ref 65–99)
HEMATOCRIT: 31 % — AB (ref 39.0–52.0)
HEMOGLOBIN: 10.5 g/dL — AB (ref 13.0–17.0)
POTASSIUM: 4.6 mmol/L (ref 3.5–5.1)
Sodium: 129 mmol/L — ABNORMAL LOW (ref 135–145)
TCO2: 16 mmol/L (ref 0–100)

## 2015-05-09 LAB — COMPREHENSIVE METABOLIC PANEL
ALT: 38 U/L (ref 17–63)
AST: 90 U/L — AB (ref 15–41)
Albumin: 1.9 g/dL — ABNORMAL LOW (ref 3.5–5.0)
Alkaline Phosphatase: 127 U/L — ABNORMAL HIGH (ref 38–126)
BUN: 164 mg/dL — ABNORMAL HIGH (ref 6–20)
CHLORIDE: 92 mmol/L — AB (ref 101–111)
CO2: 15 mmol/L — AB (ref 22–32)
CREATININE: 8.91 mg/dL — AB (ref 0.61–1.24)
Calcium: 7.4 mg/dL — ABNORMAL LOW (ref 8.9–10.3)
GFR calc Af Amer: 7 mL/min — ABNORMAL LOW (ref >60)
GFR, EST NON AFRICAN AMERICAN: 6 mL/min — AB (ref >60)
GLUCOSE: 129 mg/dL — AB (ref 65–99)
Potassium: 4.8 mmol/L (ref 3.5–5.1)
SODIUM: 132 mmol/L — AB (ref 135–145)
Total Bilirubin: 12.2 mg/dL — ABNORMAL HIGH (ref 0.3–1.2)
Total Protein: 5.4 g/dL — ABNORMAL LOW (ref 6.5–8.1)

## 2015-05-09 LAB — RENAL FUNCTION PANEL
ALBUMIN: 1.7 g/dL — AB (ref 3.5–5.0)
ANION GAP: 25 — AB (ref 5–15)
BUN: 143 mg/dL — AB (ref 6–20)
CALCIUM: 6.7 mg/dL — AB (ref 8.9–10.3)
CO2: 7 mmol/L — AB (ref 22–32)
Chloride: 103 mmol/L (ref 101–111)
Creatinine, Ser: 7.75 mg/dL — ABNORMAL HIGH (ref 0.61–1.24)
GFR calc Af Amer: 8 mL/min — ABNORMAL LOW (ref >60)
GFR calc non Af Amer: 7 mL/min — ABNORMAL LOW (ref >60)
GLUCOSE: 59 mg/dL — AB (ref 65–99)
PHOSPHORUS: 11.8 mg/dL — AB (ref 2.5–4.6)
Potassium: 5.8 mmol/L — ABNORMAL HIGH (ref 3.5–5.1)
SODIUM: 135 mmol/L (ref 135–145)

## 2015-05-09 LAB — GLUCOSE, CAPILLARY
GLUCOSE-CAPILLARY: 51 mg/dL — AB (ref 65–99)
GLUCOSE-CAPILLARY: 133 mg/dL — AB (ref 65–99)
Glucose-Capillary: 98 mg/dL (ref 65–99)
Glucose-Capillary: 92 mg/dL (ref 65–99)

## 2015-05-09 LAB — APTT
APTT: 39 s — AB (ref 24–37)
aPTT: 43 seconds — ABNORMAL HIGH (ref 24–37)

## 2015-05-09 LAB — PROCALCITONIN: Procalcitonin: 7.4 ng/mL

## 2015-05-09 LAB — PREPARE RBC (CROSSMATCH)

## 2015-05-09 LAB — PROTIME-INR
INR: 2.2 — AB (ref 0.00–1.49)
INR: 2.39 — ABNORMAL HIGH (ref 0.00–1.49)
PROTHROMBIN TIME: 23.5 s — AB (ref 11.6–15.2)
Prothrombin Time: 25.1 seconds — ABNORMAL HIGH (ref 11.6–15.2)

## 2015-05-09 LAB — RAPID URINE DRUG SCREEN, HOSP PERFORMED
AMPHETAMINES: NOT DETECTED
BARBITURATES: NOT DETECTED
Benzodiazepines: NOT DETECTED
Cocaine: NOT DETECTED
OPIATES: NOT DETECTED
TETRAHYDROCANNABINOL: NOT DETECTED

## 2015-05-09 LAB — LACTIC ACID, PLASMA: LACTIC ACID, VENOUS: 11.7 mmol/L — AB (ref 0.5–2.0)

## 2015-05-09 LAB — URINE MICROSCOPIC-ADD ON

## 2015-05-09 LAB — ETHANOL: Alcohol, Ethyl (B): <5 mg/dL (ref <5)

## 2015-05-09 LAB — FIBRINOGEN: FIBRINOGEN: 361 mg/dL (ref 204–475)

## 2015-05-09 LAB — TROPONIN I: TROPONIN I: 0.05 ng/mL — AB (ref <0.031)

## 2015-05-09 MED ORDER — OCTREOTIDE ACETATE 500 MCG/ML IJ SOLN
50.0000 ug/h | INTRAMUSCULAR | Status: DC
  Administered 2015-05-09: 50 ug/h via INTRAVENOUS
  Filled 2015-05-09 (×4): qty 1

## 2015-05-09 MED ORDER — SODIUM CHLORIDE 0.9 % IV SOLN
10.0000 mL/h | Freq: Once | INTRAVENOUS | Status: AC
  Administered 2015-05-09: 10 mL/h via INTRAVENOUS

## 2015-05-09 MED ORDER — PHENYLEPHRINE HCL 10 MG/ML IJ SOLN
0.0000 ug/min | INTRAVENOUS | Status: DC
  Administered 2015-05-09: 400 ug/min via INTRAVENOUS
  Administered 2015-05-09: 300 ug/min via INTRAVENOUS
  Administered 2015-05-09: 400 ug/min via INTRAVENOUS
  Filled 2015-05-09 (×3): qty 4

## 2015-05-09 MED ORDER — OCTREOTIDE ACETATE 50 MCG/ML IJ SOLN
50.0000 ug | Freq: Once | INTRAMUSCULAR | Status: AC
  Administered 2015-05-09: 50 ug via INTRAVENOUS
  Filled 2015-05-09: qty 1

## 2015-05-09 MED ORDER — HEPARIN SODIUM (PORCINE) 1000 UNIT/ML DIALYSIS
1000.0000 [IU] | INTRAMUSCULAR | Status: DC | PRN
  Filled 2015-05-09: qty 6

## 2015-05-09 MED ORDER — INSULIN ASPART 100 UNIT/ML ~~LOC~~ SOLN: 0.0000 [IU] | SUBCUTANEOUS | Status: DC

## 2015-05-09 MED ORDER — SODIUM BICARBONATE 8.4 % IV SOLN
200.0000 meq | Freq: Once | INTRAVENOUS | Status: AC
  Administered 2015-05-09: 200 meq via INTRAVENOUS
  Filled 2015-05-09: qty 200

## 2015-05-09 MED ORDER — ONDANSETRON HCL 4 MG/2ML IJ SOLN
4.0000 mg | Freq: Once | INTRAMUSCULAR | Status: AC
  Administered 2015-05-09: 4 mg via INTRAVENOUS
  Filled 2015-05-09: qty 2

## 2015-05-09 MED ORDER — SODIUM CHLORIDE 0.9 % IV SOLN: Freq: Once | INTRAVENOUS | Status: DC

## 2015-05-09 MED ORDER — DEXTROSE 5 % IV SOLN
2.0000 g | INTRAVENOUS | Status: DC
  Administered 2015-05-09: 2 g via INTRAVENOUS
  Filled 2015-05-09: qty 2

## 2015-05-09 MED ORDER — ROCURONIUM BROMIDE 50 MG/5ML IV SOLN
20.0000 mg | Freq: Once | INTRAVENOUS | Status: AC
  Administered 2015-05-09: 20 mg via INTRAVENOUS
  Filled 2015-05-09: qty 2

## 2015-05-09 MED ORDER — MIDAZOLAM HCL 2 MG/2ML IJ SOLN
1.0000 mg | INTRAMUSCULAR | Status: DC | PRN
  Administered 2015-05-09: 2 mg via INTRAVENOUS
  Filled 2015-05-09: qty 2

## 2015-05-09 MED ORDER — THIAMINE HCL 100 MG/ML IJ SOLN
100.0000 mg | Freq: Every day | INTRAMUSCULAR | Status: DC
  Administered 2015-05-09: 100 mg via INTRAVENOUS
  Filled 2015-05-09: qty 2

## 2015-05-09 MED ORDER — ALTEPLASE 2 MG IJ SOLR
2.0000 mg | Freq: Once | INTRAMUSCULAR | Status: DC | PRN
  Filled 2015-05-09: qty 2

## 2015-05-09 MED ORDER — NOREPINEPHRINE BITARTRATE 1 MG/ML IV SOLN
2.0000 ug/min | INTRAVENOUS | Status: DC
  Administered 2015-05-09: 2 ug/min via INTRAVENOUS
  Filled 2015-05-09: qty 4

## 2015-05-09 MED ORDER — DEXTROSE 10 % IV SOLN
INTRAVENOUS | Status: DC
  Administered 2015-05-09: 18:00:00 via INTRAVENOUS

## 2015-05-09 MED ORDER — PANTOPRAZOLE SODIUM 40 MG IV SOLR
40.0000 mg | Freq: Once | INTRAVENOUS | Status: AC
  Administered 2015-05-09: 40 mg via INTRAVENOUS
  Filled 2015-05-09: qty 40

## 2015-05-09 MED ORDER — DEXTROSE 5 % IV SOLN: 1.0000 g | INTRAVENOUS | Status: DC

## 2015-05-09 MED ORDER — MIDAZOLAM HCL 2 MG/2ML IJ SOLN
4.0000 mg | Freq: Once | INTRAMUSCULAR | Status: AC
  Administered 2015-05-09: 4 mg via INTRAVENOUS
  Filled 2015-05-09: qty 4

## 2015-05-09 MED ORDER — SODIUM CHLORIDE 0.9 % IV SOLN
INTRAVENOUS | Status: DC
  Administered 2015-05-09: 10:00:00 via INTRAVENOUS

## 2015-05-09 MED ORDER — SODIUM CHLORIDE 0.9 % IV SOLN
18.0000 ug | Freq: Once | INTRAVENOUS | Status: AC
  Administered 2015-05-09: 18 ug via INTRAVENOUS
  Filled 2015-05-09: qty 4.5

## 2015-05-09 MED ORDER — SODIUM CHLORIDE 0.9 % IV BOLUS (SEPSIS)
2000.0000 mL | Freq: Once | INTRAVENOUS | Status: AC
  Administered 2015-05-09: 2000 mL via INTRAVENOUS

## 2015-05-09 MED ORDER — SODIUM CHLORIDE 0.9 % IV SOLN
8.0000 mg/h | INTRAVENOUS | Status: DC
  Administered 2015-05-09: 8 mg/h via INTRAVENOUS
  Filled 2015-05-09 (×4): qty 80

## 2015-05-09 MED ORDER — SODIUM CHLORIDE 0.9 % FOR CRRT
INTRAVENOUS_CENTRAL | Status: DC | PRN
  Filled 2015-05-09: qty 1000

## 2015-05-09 MED ORDER — PRISMASOL BGK 4/2.5 32-4-2.5 MEQ/L IV SOLN
INTRAVENOUS | Status: DC
  Filled 2015-05-09 (×3): qty 5000

## 2015-05-09 MED ORDER — SODIUM CHLORIDE 0.9 % IV SOLN: 250.0000 mL | INTRAVENOUS | Status: DC | PRN

## 2015-05-09 MED ORDER — DEXTROSE 5 % IV SOLN
0.0000 ug/min | INTRAVENOUS | Status: DC
  Administered 2015-05-09: 75 ug/min via INTRAVENOUS
  Filled 2015-05-09: qty 1

## 2015-05-09 MED ORDER — FOLIC ACID 5 MG/ML IJ SOLN
1.0000 mg | Freq: Every day | INTRAMUSCULAR | Status: DC
  Administered 2015-05-09: 1 mg via INTRAVENOUS
  Filled 2015-05-09 (×2): qty 0.2

## 2015-05-09 MED ORDER — SODIUM CHLORIDE 0.9 % IV SOLN
0.0300 [IU]/min | INTRAVENOUS | Status: DC
  Administered 2015-05-09: 0.03 [IU]/min via INTRAVENOUS
  Filled 2015-05-09: qty 2

## 2015-05-09 MED ORDER — VITAMIN K1 10 MG/ML IJ SOLN
5.0000 mg | Freq: Once | INTRAVENOUS | Status: AC
  Administered 2015-05-09: 5 mg via INTRAVENOUS
  Filled 2015-05-09: qty 0.5

## 2015-05-09 MED ORDER — PRISMASOL BGK 4/2.5 32-4-2.5 MEQ/L IV SOLN
INTRAVENOUS | Status: DC
  Filled 2015-05-09 (×2): qty 5000

## 2015-05-09 MED ORDER — PRISMASOL BGK 4/2.5 32-4-2.5 MEQ/L IV SOLN
INTRAVENOUS | Status: DC
  Filled 2015-05-09 (×7): qty 5000

## 2015-05-09 MED ORDER — FENTANYL CITRATE (PF) 100 MCG/2ML IJ SOLN: 25.0000 ug | INTRAMUSCULAR | Status: DC | PRN

## 2015-05-09 MED ORDER — SODIUM BICARBONATE 8.4 % IV SOLN
100.0000 meq | Freq: Once | INTRAVENOUS | Status: AC
  Administered 2015-05-09: 100 meq via INTRAVENOUS
  Filled 2015-05-09: qty 50

## 2015-05-09 MED ORDER — DEXTROSE 50 % IV SOLN
25.0000 g | Freq: Once | INTRAVENOUS | Status: AC
  Administered 2015-05-09: 25 g via INTRAVENOUS

## 2015-05-09 MED ORDER — ONDANSETRON HCL 4 MG/2ML IJ SOLN: 4.0000 mg | Freq: Four times a day (QID) | INTRAMUSCULAR | Status: DC | PRN

## 2015-05-09 MED ORDER — DEXTROSE 50 % IV SOLN
INTRAVENOUS | Status: AC
  Filled 2015-05-09: qty 50

## 2015-05-09 MED ORDER — STERILE WATER FOR INJECTION IV SOLN
INTRAVENOUS | Status: DC
  Administered 2015-05-09 (×2): via INTRAVENOUS
  Filled 2015-05-09 (×2): qty 850

## 2015-05-09 MED ORDER — ETOMIDATE 2 MG/ML IV SOLN
20.0000 mg | Freq: Once | INTRAVENOUS | Status: AC
  Administered 2015-05-09: 20 mg via INTRAVENOUS

## 2015-05-09 MED ORDER — SODIUM CHLORIDE 0.9 % IV BOLUS (SEPSIS)
1000.0000 mL | Freq: Once | INTRAVENOUS | Status: AC
  Administered 2015-05-09: 1000 mL via INTRAVENOUS

## 2015-05-09 NOTE — Progress Notes (Signed)
eLink Physician-Brief Progress Note Patient Name: Ricardo QuailJuan Koch DOB: 03-15-1960 MRN: [id number]   Date of Service  05/21/2015  HPI/Events of Note  Hypoglycemia - blood glucose = 51. Patient is getting D50 now.   eICU Interventions  Will order D10W to run IV at 40 mL/hour.      Intervention Category Major Interventions: Other:  Lenell AntuSommer,Kailynne Ferrington Eugene 05/12/2015, 5:18 PM

## 2015-05-09 NOTE — ED Notes (Signed)
Ricardo Koch at bedside 

## 2015-05-09 NOTE — Progress Notes (Signed)
Full note to follow:  Spoke with Dr. Jenean Lindauhodes, [facility], as well as with Lt. Whitley with the jail re: Ricardo Koch's worsening MOF. He has impending death, and I reccommended against escalating care to include placement of HD catheter, or even CVC given his reasonable access presently and severe coagulopathy and uremia. Dr. Jenean Lindauhodes agreed with futility of escalation of care, and concurred with the plan for Ricardo Koch to be DNR/DNI, without plan for escalation to include more blood products, ACLS care (including amps of bicarb above his current gtt), or procedures. Lt. Jodelle GreenWhitley confirmed that no family was on file with the jail, as well as the confirmed plan of care w/ Dr. Jenean Lindauhodes.  Jamie KatoAaron Preslee Regas, MD Pulmonary & Critical Care Medicine May 09, 2015, 8:10 PM

## 2015-05-09 NOTE — ED Provider Notes (Signed)
CSN: [id number]     Arrival date & time 05/28/15  0831 History   First MD Initiated Contact with Patient 05/28/15 [phone]     Chief Complaint  Patient presents with  . Epistaxis  . Hypotension     (Consider location/radiation/quality/duration/timing/severity/associated sxs/prior Treatment) The history is provided by the patient and the police.  Patient brought in the share from the jail. Report was intermittent nosebleeds for the past 2 days. Patient's been in the jail for 22 days. Patient has a history of alcohol abuse. Patient was hypotensive on arrival and moaning but would answer some questions when spoken to in [place]. It appears that this is more of a GI bleed and a nosebleed. Patient will dried blood in the mouth. Patient was hypotensive initial blood pressure was in the 70s. He was not tachycardic.  Past Medical History  Diagnosis Date  . ETOH abuse    History reviewed. No pertinent past surgical history. No family history on file. Social History  Substance Use Topics  . Smoking status: Never Smoker   . Smokeless tobacco: None  . Alcohol Use: Yes    Review of Systems  Constitutional: Negative for fever.  HENT: Negative for trouble swallowing.   Eyes: Negative for redness.  Respiratory: Negative for shortness of breath.   Cardiovascular: Negative for chest pain and leg swelling.  Gastrointestinal: Positive for nausea, vomiting and abdominal distention. Negative for abdominal pain.  Genitourinary: Positive for difficulty urinating.  Skin: Negative for rash.  Hematological: Bruises/bleeds easily.  Psychiatric/Behavioral: Positive for confusion.      Allergies  Review of patient's allergies indicates no known allergies.  Home Medications   Prior to Admission medications   Not on File   BP 90/50 mmHg  Pulse 91  Resp 18  SpO2 100% Physical Exam  Constitutional: He appears well-developed and well-nourished. He appears distressed.  HENT:  Head: Normocephalic and  atraumatic.  Because membranes  dry. Dried dark blood in the oropharynx area. No evidence of nosebleed.  Eyes: EOM are normal. Pupils are equal, round, and reactive to light. Scleral icterus is present.  Neck: Neck supple.  Cardiovascular: Normal rate, regular rhythm and normal heart sounds.   No murmur heard. Pulmonary/Chest: Effort normal and breath sounds normal. He has no wheezes.  Abdominal: Soft. Bowel sounds are normal. He exhibits distension. There is no tenderness.  Genitourinary: Guaiac positive stool.  Musculoskeletal: Normal range of motion. He exhibits no edema.  Neurological: He is alert. No cranial nerve deficit. He exhibits normal muscle tone. Coordination normal.  Skin: Skin is warm. No rash noted.  Nursing note and vitals reviewed.   ED Course  Procedures (including critical care time) Labs Review Labs Reviewed  COMPREHENSIVE METABOLIC PANEL  LIPASE, BLOOD  PROTIME-INR  URINALYSIS, ROUTINE W REFLEX MICROSCOPIC (NOT AT [facility])  ETHANOL  URINE RAPID DRUG SCREEN, HOSP PERFORMED  APTT  AMMONIA  I-STAT CHEM 8, ED  TYPE AND SCREEN   Results for orders placed or performed during the hospital encounter of 2015-05-28  Comprehensive metabolic panel  Result Value Ref Range   Sodium 132 (L) 135 - 145 mmol/L   Potassium 4.8 3.5 - 5.1 mmol/L   Chloride 92 (L) 101 - 111 mmol/L   CO2 15 (L) 22 - 32 mmol/L   Glucose, Bld 129 (H) 65 - 99 mg/dL   BUN 119 (H) 6 - 20 mg/dL   Creatinine, Ser 1.47 (H) 0.61 - 1.24 mg/dL   Calcium 7.4 (L) 8.9 - 10.3 mg/dL  Total Protein 5.4 (L) 6.5 - 8.1 g/dL   Albumin 1.9 (L) 3.5 - 5.0 g/dL   AST 90 (H) 15 - 41 U/L   ALT 38 17 - 63 U/L   Alkaline Phosphatase 127 (H) 38 - 126 U/L   Total Bilirubin 12.2 (H) 0.3 - 1.2 mg/dL   GFR calc non Af Amer 6 (L) >60 mL/min   GFR calc Af Amer 7 (L) >60 mL/min  Lipase, blood  Result Value Ref Range   Lipase 98 (H) 11 - 51 U/L  Protime-INR  Result Value Ref Range   Prothrombin Time 23.5 (H) 11.6 - 15.2  seconds   INR 2.20 (H) 0.00 - 1.49  Ethanol  Result Value Ref Range   Alcohol, Ethyl (B) <5 <5 mg/dL  Urine rapid drug screen (hosp performed)  Result Value Ref Range   Opiates NONE DETECTED NONE DETECTED   Cocaine NONE DETECTED NONE DETECTED   Benzodiazepines NONE DETECTED NONE DETECTED   Amphetamines NONE DETECTED NONE DETECTED   Tetrahydrocannabinol NONE DETECTED NONE DETECTED   Barbiturates NONE DETECTED NONE DETECTED  APTT  Result Value Ref Range   aPTT 39 (H) 24 - 37 seconds  Ammonia  Result Value Ref Range   Ammonia 182 (H) 9 - 35 umol/L  I-Stat Chem 8, ED  Result Value Ref Range   Sodium 129 (L) 135 - 145 mmol/L   Potassium 4.6 3.5 - 5.1 mmol/L   Chloride 93 (L) 101 - 111 mmol/L   BUN >140 (H) 6 - 20 mg/dL   Creatinine, Ser 1.618.60 (H) 0.61 - 1.24 mg/dL   Glucose, Bld 096115 (H) 65 - 99 mg/dL   Calcium, Ion 0.450.85 (L) 1.12 - 1.23 mmol/L   TCO2 16 0 - 100 mmol/L   Hemoglobin 10.5 (L) 13.0 - 17.0 g/dL   HCT 40.931.0 (L) 81.139.0 - 91.452.0 %  Type and screen [facility]  Result Value Ref Range   ABO/RH(D) O POS    Antibody Screen POS    Sample Expiration 05/12/2015   Prepare RBC  Result Value Ref Range   Order Confirmation ORDER PROCESSED BY BLOOD BANK    Results for orders placed or performed during the hospital encounter of 05/08/2015  Comprehensive metabolic panel  Result Value Ref Range   Sodium 132 (L) 135 - 145 mmol/L   Potassium 4.8 3.5 - 5.1 mmol/L   Chloride 92 (L) 101 - 111 mmol/L   CO2 15 (L) 22 - 32 mmol/L   Glucose, Bld 129 (H) 65 - 99 mg/dL   BUN 782164 (H) 6 - 20 mg/dL   Creatinine, Ser 9.568.91 (H) 0.61 - 1.24 mg/dL   Calcium 7.4 (L) 8.9 - 10.3 mg/dL   Total Protein 5.4 (L) 6.5 - 8.1 g/dL   Albumin 1.9 (L) 3.5 - 5.0 g/dL   AST 90 (H) 15 - 41 U/L   ALT 38 17 - 63 U/L   Alkaline Phosphatase 127 (H) 38 - 126 U/L   Total Bilirubin 12.2 (H) 0.3 - 1.2 mg/dL   GFR calc non Af Amer 6 (L) >60 mL/min   GFR calc Af Amer 7 (L) >60 mL/min  Lipase, blood   Result Value Ref Range   Lipase 98 (H) 11 - 51 U/L  Protime-INR  Result Value Ref Range   Prothrombin Time 23.5 (H) 11.6 - 15.2 seconds   INR 2.20 (H) 0.00 - 1.49  Ethanol  Result Value Ref Range   Alcohol, Ethyl (B) <5 <5 mg/dL  Urine rapid drug screen (hosp performed)  Result Value Ref Range   Opiates NONE DETECTED NONE DETECTED   Cocaine NONE DETECTED NONE DETECTED   Benzodiazepines NONE DETECTED NONE DETECTED   Amphetamines NONE DETECTED NONE DETECTED   Tetrahydrocannabinol NONE DETECTED NONE DETECTED   Barbiturates NONE DETECTED NONE DETECTED  APTT  Result Value Ref Range   aPTT 39 (H) 24 - 37 seconds  Ammonia  Result Value Ref Range   Ammonia 182 (H) 9 - 35 umol/L  I-Stat Chem 8, ED  Result Value Ref Range   Sodium 129 (L) 135 - 145 mmol/L   Potassium 4.6 3.5 - 5.1 mmol/L   Chloride 93 (L) 101 - 111 mmol/L   BUN >140 (H) 6 - 20 mg/dL   Creatinine, Ser 1.61 (H) 0.61 - 1.24 mg/dL   Glucose, Bld 096 (H) 65 - 99 mg/dL   Calcium, Ion 0.45 (L) 1.12 - 1.23 mmol/L   TCO2 16 0 - 100 mmol/L   Hemoglobin 10.5 (L) 13.0 - 17.0 g/dL   HCT 40.9 (L) 81.1 - 91.4 %  Type and screen [facility]  Result Value Ref Range   ABO/RH(D) O POS    Antibody Screen POS    Sample Expiration 05/12/2015   Prepare RBC  Result Value Ref Range   Order Confirmation ORDER PROCESSED BY BLOOD BANK    Dg Chest Port 1 View  06-01-15  CLINICAL DATA:  Hypotension. EXAM: PORTABLE CHEST 1 VIEW COMPARISON:  None. FINDINGS: No pneumothorax. The heart, hila, and mediastinum are normal. No pulmonary nodules, masses, or infiltrates. IMPRESSION: No active disease. Electronically Signed   By: Gerome Sam III M.D   On: Jun 01, 2015 10:05      Imaging Review No results found. I have personally reviewed and evaluated these images and lab results as part of my medical decision-making.   EKG Interpretation None      ED ECG REPORT   Date: 06-01-2015  Rate: 80  Rhythm: normal sinus  rhythm  QRS Axis: normal  Intervals: normal  ST/T Wave abnormalities: nonspecific ST/T changes  Conduction Disutrbances:none  Narrative Interpretation:   Old EKG Reviewed: none available  I have personally reviewed the EKG tracing and agree with the computerized printout as noted.   CRITICAL CARE Performed by: Vanetta Mulders Total critical care time: 60 minutes Critical care time was exclusive of separately billable procedures and treating other patients. Critical care was necessary to treat or prevent imminent or life-threatening deterioration. Critical care was time spent personally by me on the following activities: development of treatment plan with patient and/or surrogate as well as nursing, discussions with consultants, evaluation of patient's response to treatment, examination of patient, obtaining history from patient or surrogate, ordering and performing treatments and interventions, ordering and review of laboratory studies, ordering and review of radiographic studies, pulse oximetry and re-evaluation of patient's condition.    MDM   Final diagnoses:  Gastrointestinal hemorrhage, unspecified gastritis, unspecified gastrointestinal hemorrhage type   Patient with a significant GI bleed. Concerning for supper Gille bleed or worse bleed from upper GI tract. Patient hypotensive. Patient received 2 L of normal saline blood pressure came up into the low 90s systolic. Patient remained of with a normal sinus rhythm was not tachycardic. Patient's initial hemoglobin was 10.5. Patient appeared clinically very very dry. Patient's INR is elevated at above 2. Patient's BUN and creatinine are very high suggestive of prerenal type picture potassium is normal. Patient with a little bit of confusion when  he first arrived suggestive of hepatic encephalopathy. As resuscitation of went on patient became more confused now not answering questions. But is moving spontaneously. No evidence of any seizure  activity. Patient's blood pressures now around 112 heart rate is now up to 125. Consistent with sinus tachycardia. Discussed with GI medicine and pulmonary critical care for admission to ICU. Patient also when he first came in was given Zofran given Protonix and given octreotide.  No further vomiting of blood but has had melanotic stools. Patient chest x-ray was negative. Patient bilirubin markedly elevated. Suspect the worsening mental status is related to hepatic encephalopathy. Patient's ammonia level is 180.   Patient will require admission to ICU. Blood products were ordered having difficulty type and crossing him due to antibodies. Repeat H&H is been ordered. But currently other than the tachycardia patient's blood pressures are stable. Feel that the BUN/creatinine are related somewhat to the GI bleed and to a prerenal picture. With fluids patient started to make some urine but not has not made a lot.  Patients of Foley temp of was hypothermic however patient feels warm even to the 4 head. Does not feel like has a fever. Not sure if this is actually accurate but he did start out at 92.3 now is up to 94.5. We'll continue the bear hugger.  If patient's mental status continues to get worse may require intubation.     Vanetta Mulders, MD 2015/06/04 1128

## 2015-05-09 NOTE — Progress Notes (Signed)
Pharmacy Brief Note  Pharmacy has been requested to concentrate the phenylephrine infusion bag due to high rate of infusion.  Pharmacy changed order for phenylephrine 10 mg in 250 mL D5W (single strength) to 40 mg in 250 mL D5W (quadruple strength).  Pharmacy has communicated to RN the need to adjust infusion pump settings for quad strength phenylephine.  RN Onalee Hua(David) verbalized understanding.  Clance BollAmanda Myesha Stillion, PharmD, BCPS Pager: [phone] 05/04/2015 4:33 PM

## 2015-05-09 NOTE — Progress Notes (Signed)
CRITICAL VALUE ALERT  Critical value received:  Lactic Acid 11.7, Platlet 25, potassium 5.8  Date of notification:  05/08/2015  Time of notification:  1800  Critical value read back:Yes.    Nurse who received alert:  Eddie Candleavid Alva Broxson  MD notified (1st page):  Elink Dr Arsenio LoaderSommer  Time of first page:  1800  MD notified (2nd page): Elink Dr Arsenio LoaderSommer  Time of second page:1820  Responding MD:  Dr. Arsenio LoaderSommer  Time MD responded:  [phone]

## 2015-05-09 NOTE — Consult Note (Signed)
[facility] CONSULT Reason for consult: hematemesis Referring Physician: ER. No PCP or primary G.I.  Ricardo Koch is an 55 y.o. male.  HPI: he was brought to the emergency room by the police after having been incarcerated since 3/22. He has a long history of alcohol abuse. He apparently had lack of response and was found to be hypotensive and somewhat nonresponsive. His initial blood pressure was in the 70s. Since coming into the emergency room he has received a lot of fluid. He apparently had been having some dark stools. We don't really know anything else about his family history. His initial labs reveal marked increase of bilirubin up to 12 with a marked increase in pneumonia up to 182 hemoglobin 10.5 and INR 2.2 protime 23. Unfortunately, creatinine markedly elevated it 8.9  Past Medical History  Diagnosis Date  . ETOH abuse     History reviewed. No pertinent past surgical history.  No family history on file.  Social History:  reports that he has never smoked. He does not have any smokeless tobacco history on file. He reports that he drinks alcohol. He reports that he does not use illicit drugs.  Allergies: No Known Allergies  Medications; Prior to Admission medications   Not on File   . desmopressin (DDAVP) IV  18 mcg Intravenous Once   PRN Meds sodium chloride, fentaNYL (SUBLIMAZE) injection, midazolam, ondansetron (ZOFRAN) IV Results for orders placed or performed during the hospital encounter of 05/13/2015 (from the past 48 hour(s))  Comprehensive metabolic panel     Status: Abnormal   Collection Time: 05/26/2015  9:15 AM  Result Value Ref Range   Sodium 132 (L) 135 - 145 mmol/L   Potassium 4.8 3.5 - 5.1 mmol/L   Chloride 92 (L) 101 - 111 mmol/L   CO2 15 (L) 22 - 32 mmol/L   Glucose, Bld 129 (H) 65 - 99 mg/dL   BUN 164 (H) 6 - 20 mg/dL    Comment: RESULTS CONFIRMED BY MANUAL DILUTION   Creatinine, Ser 8.91 (H) 0.61 - 1.24 mg/dL   Calcium 7.4 (L) 8.9 -  10.3 mg/dL   Total Protein 5.4 (L) 6.5 - 8.1 g/dL   Albumin 1.9 (L) 3.5 - 5.0 g/dL   AST 90 (H) 15 - 41 U/L   ALT 38 17 - 63 U/L   Alkaline Phosphatase 127 (H) 38 - 126 U/L   Total Bilirubin 12.2 (H) 0.3 - 1.2 mg/dL   GFR calc non Af Amer 6 (L) >60 mL/min   GFR calc Af Amer 7 (L) >60 mL/min    Comment: (NOTE) The eGFR has been calculated using the CKD EPI equation. This calculation has not been validated in all clinical situations. eGFR's persistently <60 mL/min signify possible Chronic Kidney Disease.   Lipase, blood     Status: Abnormal   Collection Time: 05/20/2015  9:15 AM  Result Value Ref Range   Lipase 98 (H) 11 - 51 U/L  Protime-INR     Status: Abnormal   Collection Time: 05/07/2015  9:15 AM  Result Value Ref Range   Prothrombin Time 23.5 (H) 11.6 - 15.2 seconds   INR 2.20 (H) 0.00 - 1.49  Type and screen [place]     Status: None   Collection Time: 05/20/2015  9:15 AM  Result Value Ref Range   ABO/RH(D) O POS    Antibody Screen POS    Sample Expiration 05/12/2015    Antibody Identification ANTI K   Ethanol  Status: None   Collection Time: 05/12/2015  9:15 AM  Result Value Ref Range   Alcohol, Ethyl (B) <5 <5 mg/dL    Comment:        LOWEST DETECTABLE LIMIT FOR SERUM ALCOHOL IS 5 mg/dL FOR MEDICAL PURPOSES ONLY   APTT     Status: Abnormal   Collection Time: 05/26/2015  9:15 AM  Result Value Ref Range   aPTT 39 (H) 24 - 37 seconds    Comment:        IF BASELINE aPTT IS ELEVATED, SUGGEST PATIENT RISK ASSESSMENT BE USED TO DETERMINE APPROPRIATE ANTICOAGULANT THERAPY.   Ammonia     Status: Abnormal   Collection Time: 05/19/2015  9:15 AM  Result Value Ref Range   Ammonia 182 (H) 9 - 35 umol/L  I-Stat Chem 8, ED     Status: Abnormal   Collection Time: 05/16/2015  9:26 AM  Result Value Ref Range   Sodium 129 (L) 135 - 145 mmol/L   Potassium 4.6 3.5 - 5.1 mmol/L   Chloride 93 (L) 101 - 111 mmol/L   BUN >140 (H) 6 - 20 mg/dL   Creatinine, Ser  8.60 (H) 0.61 - 1.24 mg/dL   Glucose, Bld 115 (H) 65 - 99 mg/dL   Calcium, Ion 0.85 (L) 1.12 - 1.23 mmol/L   TCO2 16 0 - 100 mmol/L   Hemoglobin 10.5 (L) 13.0 - 17.0 g/dL   HCT 31.0 (L) 39.0 - 52.0 %  Prepare RBC     Status: None   Collection Time: 05/29/2015 10:30 AM  Result Value Ref Range   Order Confirmation ORDER PROCESSED BY BLOOD BANK   Urinalysis, Routine w reflex microscopic (not at [facility])     Status: Abnormal   Collection Time: 05/21/2015 10:32 AM  Result Value Ref Range   Color, Urine ORANGE (A) YELLOW    Comment: BIOCHEMICALS MAY BE AFFECTED BY COLOR   APPearance TURBID (A) CLEAR   Specific Gravity, Urine 1.018 1.005 - 1.030   pH 5.5 5.0 - 8.0   Glucose, UA NEGATIVE NEGATIVE mg/dL   Hgb urine dipstick LARGE (A) NEGATIVE   Bilirubin Urine MODERATE (A) NEGATIVE   Ketones, ur NEGATIVE NEGATIVE mg/dL   Protein, ur 100 (A) NEGATIVE mg/dL   Nitrite POSITIVE (A) NEGATIVE   Leukocytes, UA LARGE (A) NEGATIVE  Urine rapid drug screen (hosp performed)     Status: None   Collection Time: 05/03/2015 10:32 AM  Result Value Ref Range   Opiates NONE DETECTED NONE DETECTED   Cocaine NONE DETECTED NONE DETECTED   Benzodiazepines NONE DETECTED NONE DETECTED   Amphetamines NONE DETECTED NONE DETECTED   Tetrahydrocannabinol NONE DETECTED NONE DETECTED   Barbiturates NONE DETECTED NONE DETECTED    Comment:        DRUG SCREEN FOR MEDICAL PURPOSES ONLY.  IF CONFIRMATION IS NEEDED FOR ANY PURPOSE, NOTIFY LAB WITHIN 5 DAYS.        LOWEST DETECTABLE LIMITS FOR URINE DRUG SCREEN Drug Class       Cutoff (ng/mL) Amphetamine      1000 Barbiturate      200 Benzodiazepine   734 Tricyclics       193 Opiates          300 Cocaine          300 THC              50   Urine microscopic-add on     Status: Abnormal   Collection Time: 05/24/2015 10:32  AM  Result Value Ref Range   Squamous Epithelial / LPF 0-5 (A) NONE SEEN   WBC, UA TOO NUMEROUS TO COUNT 0 - 5 WBC/hpf   RBC / HPF 6-30 0 - 5 RBC/hpf    Bacteria, UA MANY (A) NONE SEEN    Dg Chest Port 1 View  05/30/2015  CLINICAL DATA:  Hypotension. EXAM: PORTABLE CHEST 1 VIEW COMPARISON:  None. FINDINGS: No pneumothorax. The heart, hila, and mediastinum are normal. No pulmonary nodules, masses, or infiltrates. IMPRESSION: No active disease. Electronically Signed   By: Dorise Bullion III M.D   On:  10:05   MBO:[id number]             Blood pressure 117/49, pulse 130, temperature 94.5 F (34.7 C), resp. rate 16, weight 58.968 kg (130 lb), SpO2 98 %.  Physical exam:   General-- Hispanic male minimally responsive. Dried blood in his mouth. ENT-- icteric  Heart-- tachycardic Lungs-- clear Abdomen-- liver enlarged impalpable below the costal margin Psych-- minimally responsiv   Assessment: 1. Upper G.I. bleeding possibly variceal. 2. 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 Marked mental status changes could be multifactorial 4. Acute renal failure questionably associated with hepatorenal syndrome  Plan: 1. Since patient does not appear to be currently bleeding, I would go ahead and start him on octreotide infusion and 50 micrograms per hour infusion. 2. Would consider culture and empiric treatment with antibiotics since G.I. bleeding and cirrhotic's is known to precipitate encephalopathy 3. Would give lactulose orally or via NG patient has multiple system failure and if this is all from his alcoholic cirrhosis his prognosis is not good. If he believes acutely he may need urgent EGD or EGD with possible banding when he is more stable.   Rolan Wrightsman JR,Lesa Vandall L 05/02/2015, 11:56 AM   This note was created using voice recognition software and minor errors may Have occurred unintentionally. Pager: [phone] If no answer or after hours call [phone]

## 2015-05-09 NOTE — Consult Note (Signed)
Renal Service Consult Note [facility]  Ricardo Koch 05/08/2015 Ricardo Koch D Requesting Physician:  Dr. Jamison Neighbor  Reason for Consult:  Inmate at jail found unresponsive. Has severe azotemia.  HPI: The patient is a 55 y.o. year-old with hx of ETOH abuse found poorly responsive and w hematemesis. Intubated in ED.  In ICU now.  Severe renal failure/ azotemia.  Severe hyperammonemia.  Severe hyperbilirubinemia, US shows nodular liver d/w cirrhosis.  Hx etoh use. No other history. Got IV vit K and DDAVP for bleeding.  Transfused 1 prbc and 1 FFP.  On octreotide and protonix ftt.  UA 1.018.  No old labs in chart. Pt not responsive.     ROS n/a   Past Medical History  Past Medical History  Diagnosis Date  . ETOH abuse    Past Surgical History History reviewed. No pertinent past surgical history. Family History No family history on file. Social History  reports that he has never smoked. He does not have any smokeless tobacco history on file. He reports that he drinks alcohol. He reports that he does not use illicit drugs. Allergies No Known Allergies Home medications Prior to Admission medications   Not on File   Liver Function Tests  Recent Labs Lab 05/03/2015 0915  AST 90*  ALT 38  ALKPHOS 127*  BILITOT 12.2*  PROT 5.4*  ALBUMIN 1.9*    Recent Labs Lab 05/22/2015 0915  LIPASE 98*   CBC  Recent Labs Lab 05/26/2015 0926  HGB 10.5*  HCT 31.0*   Basic Metabolic Panel  Recent Labs Lab 05/25/2015 0915 05/28/2015 0926  NA 132* 129*  K 4.8 4.6  CL 92* 93*  CO2 15*  --   GLUCOSE 129* 115*  BUN 164* >140*  CREATININE 8.91* 8.60*  CALCIUM 7.4*  --     Filed Vitals:   05/20/2015 1320 05/08/2015 1357 05/02/2015 1412  1425  BP: 124/86  89/34   Pulse: 128 123 124   Temp:  95.4 F (35.2 C) 95.2 F (35.1 C)   TempSrc:  Core (Comment)    Resp: Height:    5' 5.5" (1.664 m)  Weight:      SpO2: 100% 93% 94%    Exam Intubated,  unresponsive No rash, cyanosis or gangrene Sclera anicteric, throat w ETT in place  No jvd, flat neck veins Chest coarse rhonchi R chest, L clear RRR no MRG,, tachy, quiet precordium Abd soft ntnd no mass, no ascites, no hsm, no venous distension GU normal male MS no joint effusions or deformity Ext no LE or UE edema / no wounds or ulcers Neuro is sedated on the vent  7.10/ 39/ 293 on vent Na 132 K 4.8  Cl92  CO2 15  BUN 164 creat 8.91 Ca 7.4  Glu 115    Alb 1.9   AST90 AlT 38  TProt 5.4  NH3 182 Tbili 12.2   Hb 10.5  WBC UA > turbid, many bact, mod bili, orange , neg glu, lrg Hb, ketone neg, large LE, +nitrite, 5.5 pH, 100 prot, 7-30 rbc/ TNTC wbc/ 0-5 epis Abd [place] > multiple gallstones.  GB wall thick up to 6mm.  Nodular liver contour.  Portal vein w normal hepatopetal flow. Impression > (1) hepatic cirrhosis (2) gallstones (3) pancreas obscured by bowel gas (4) kidneys are 11-12 cm no hydro, well-preserved cortex, normal echo.  EKG sinus tach, prolonged QTc interval  Assessment: 1. Severe renal failure/ azotemia - presumed acute renal  insufficiency.  Looks quite dry on exam.  Could have hepatorenal syndrome, need UNa; but also have to replete volume completely before making diagnosis of HRS.  Other causes of ATN, AIN to be considered.  2. Severe hyperbilirubinemia 3. Cirrhosis/ liver failure - by US imaging, ^^NH3/ tbili/ INR 4. AMS - on vent 5. Vol depletion 6. Metabolic acidosis - severe 7. Hypothermia - not sure sepsis vs due to other organ failure 8. Hematemesis - the least of his problems at this point   Plan - change IVF to all NaHCO3 and increase to 200 cc/ hr.  Needs temp HD cath, will d/w CCM.  Needs CRRT, orders written. Also looks dry and could use CVP monitoring for volume resuscitation, but defer to CCM on this.  Check Urine Na, creat.  Will follow.   Vinson Moselleob Jayvyn Haselton MD [facility] pager [phone]    cell [phone] 05/22/2015, 3:18 PM

## 2015-05-09 NOTE — Progress Notes (Addendum)
eLink Physician-Brief Progress Note Patient Name: Ricardo QuailJuan Koch DOB: 08-27-1960 MRN: [id number]   Date of Service  05/19/2015  HPI/Events of Note  ABG on 40%/PRVC  /TV 500/P 5 = 7.097/24.4/83.3/7.4 c/w severe metabolic acidosis with near maximal respiratory compensation. Patient is ordered to start on a NaHCO3 IV infusion at 200 mL/hour.  eICU Interventions  Will order: 1. NaHCO3 100 meq IV now.  2. ABG at 6 PM.     Intervention Category Major Interventions: Acid-Base disturbance - evaluation and management  Letina Luckett Eugene 05/20/2015, 4:24 PM

## 2015-05-09 NOTE — Progress Notes (Signed)
eLink Physician-Brief Progress Note Patient Name: Ricardo QuailJuan Koch DOB: [birth year] MRN: [id number]   Date of Service  05/07/2015  HPI/Events of Note  ABG = 7.076/23.5/86.1/6.6. Patient is currently on a NaHCO3 IV infusion.   eICU Interventions  Will order: 1. NaHCO3 200 meq IV now.  2. ABG at 7:30 PM.     Intervention Category Major Interventions: Acid-Base disturbance - evaluation and management;Respiratory failure - evaluation and management  Lenell AntuSommer,Shaylah Mcghie Eugene 05/19/2015, 6:49 PM

## 2015-05-09 NOTE — ED Notes (Signed)
Bed: [id number] Expected date:  Expected time:  Means of arrival:  Comments: EMS- nosebleed

## 2015-05-09 NOTE — Progress Notes (Signed)
Pt has expired and has no spontaneous heart sounds noted, pronounced by two RNs time of death is 2232 pm.  MD made aware. Pt has no next of kin on file.  Pt has officer at bedside due to pt was in police custody.

## 2015-05-09 NOTE — Progress Notes (Signed)
eLink Physician-Brief Progress Note Patient Name: Ricardo QuailJuan Acevedo-Zamora DOB: Feb 10, 1960 MRN: [id number]   Date of Service  05/21/2015  HPI/Events of Note  Hypotension - BP 89/34.  eICU Interventions  Will order: 1. Bolus with 0.9 NaCl 1 liter IV over 1 hour now. 2. Phenylephrine IV infusion. Titrate to MAP >= 65.     Intervention Category Major Interventions: Hypotension - evaluation and management  Sommer,Steven Eugene 05/07/2015, 3:15 PM

## 2015-05-09 NOTE — Progress Notes (Signed)
PCCM Attending Note: ABG shows significant metabolic acidosis. Increasing ventilator set rate to 26bpm. Starting Bicarb drip at 75cc/hr. Repeat ABG at 1500 today.  Donna ChristenJennings E. Jamison NeighborNestor, M.D. [facility] Pager:  [phone] After 3pm or if no response, call [phone] 2:00 PM 2015/04/13

## 2015-05-09 NOTE — Procedures (Signed)
Medications Administered: 1. Etomidate 20 mg IV push 2. Versed 4 mg IV push 3. Rocuronium 20 mg IV push  Complications:  None.  ETT:  7.5 secured at 25cm at the teeth  Description of the Procedure: Procedure was performed emergently due to altered mental status and inability to protect his airway with acute hypoxic respiratory failure. Medications were administered for rapid sequence intubation first sedating with etomidate followed by rocuronium once patient was sedate. Additional Versed was administered IV as well. Utilizing a video laryngoscope I was able to visualize the patient's vocal cords as well as a substantial amount of blood within retropharynx. Blood was suctioned. A 7.5 endotracheal tube was then inserted between the vocal cords with ease under direct visualization. Endotracheal tube was secured at 25 cm at the teeth. Repeat capnographic color change was appreciated. Bilateral breath sounds were auscultated. Patient remained hemodynamically stable throughout the procedure.

## 2015-05-09 NOTE — Progress Notes (Signed)
Pt glucose around 1600  was 51.  Admin 25mg  of D50.  Rechecked glucose and was 133.  Informed Elink and started D10.  Erick Blinksuchman, Kalief Kattner D, RN

## 2015-05-09 NOTE — ED Notes (Signed)
Bear hugger placed on patient.

## 2015-05-09 NOTE — H&P (Signed)
PULMONARY / CRITICAL CARE MEDICINE   Name: Ricardo QuailJuan Koch MRN: [id number] DOB: August 07, 1960    ADMISSION DATE:  05/13/2015 CONSULTATION DATE:  05/08/2015  REFERRING MD:  Vanetta MuldersScott Zackowski, M.D. / EDP  CHIEF COMPLAINT:  Hematemesis with Acute Renal Failure  HISTORY OF PRESENT ILLNESS:   Patient presented to the emergency department today with hematemesis. Per the emergency department provider patient had no complaints. Specifically he denied any abdominal pain. On presentation he was hypotensive and was not tachycardic. With fluid resuscitation he progressively resolved his hypotension but became more tachycardic and altered. History was unable to be obtained from the patient given altered mental status. [facility] who informed me that he did admit to drinking approximately 6 cans of beer on weekends but denied any other medical history and denied any other physical exams.  PAST MEDICAL HISTORY :  No prior medical problems.  PAST SURGICAL HISTORY: No prior surgeries.  No Known Allergies  HOME MEDICATIONS: None  FAMILY HISTORY:  No known family medical history.  SOCIAL HISTORY: Social History  Substance Use Topics  . Smoking status: Never Smoker   . Smokeless tobacco: None  . Alcohol Use: Yes    REVIEW OF SYSTEMS:  Unable to obtain given altered mentation.  SUBJECTIVE:   VITAL SIGNS: BP 112/69 mmHg  Pulse 138  Temp(Src) 94.5 F (34.7 C)  Resp 19  SpO2 99%  HEMODYNAMICS:    VENTILATOR SETTINGS:    INTAKE / OUTPUT:    PHYSICAL EXAMINATION: General:  Altered. Moderate distress. Guarded bedside.  Integument:  Warm & dry. No rash on exposed skin. Jaundiced. Lymphatics:  No appreciated cervical or supraclavicular lymphadenoapthy. HEENT:  Dried blood within oropharynx. No scleral injection.  Cardiovascular:  Tachycardic with sinus rhythm on monitor. No edema. No appreciable JVD.  Pulmonary:  Good aeration & clear to auscultation bilaterally.  Normal work of breathing on nasal cannula oxygen. Abdomen: Soft. Normal bowel sounds. Nondistended.  Musculoskeletal:  Normal bulk and tone.No joint deformity or effusion appreciated. Neurological:  Patient spontaneously moving all 4 extremities. Gaze is shifting. Does not follow commands or answer questions. Mumbling incoherently. Psychiatric:  Unable to assess given altered mental status.   LABS:  BMET  Recent Labs Lab 05/22/2015 0915 05/13/2015 0926  NA 132* 129*  K 4.8 4.6  CL 92* 93*  CO2 15*  --   BUN 164* >140*  CREATININE 8.91* 8.60*  GLUCOSE 129* 115*    Electrolytes  Recent Labs Lab 05/21/2015 0915  CALCIUM 7.4*    CBC  Recent Labs Lab 05/18/2015 0926  HGB 10.5*  HCT 31.0*    Coag's  Recent Labs Lab 05/25/2015 0915  APTT 39*  INR 2.20*    Sepsis Markers No results for input(s): LATICACIDVEN, PROCALCITON, O2SATVEN in the last 168 hours.  ABG No results for input(s): PHART, PCO2ART, PO2ART in the last 168 hours.  Liver Enzymes  Recent Labs Lab 05/30/2015 0915  AST 90*  ALT 38  ALKPHOS 127*  BILITOT 12.2*  ALBUMIN 1.9*    Cardiac Enzymes No results for input(s): TROPONINI, PROBNP in the last 168 hours.  Glucose No results for input(s): GLUCAP in the last 168 hours.  Imaging Dg Chest Port 1 View  05/08/2015  CLINICAL DATA:  Hypotension. EXAM: PORTABLE CHEST 1 VIEW COMPARISON:  None. FINDINGS: No pneumothorax. The heart, hila, and mediastinum are normal. No pulmonary nodules, masses, or infiltrates. IMPRESSION: No active disease. Electronically Signed   By: Gerome Samavid  Williams III M.D  On: 05/04/2015 10:05     STUDIES:  Port CXR 4/8:  Personally reviewed by me. Low lung volumes. No focal opacity or effusion appreciated. Port CXR 4/8:  Personally reviewed by me.Endotracheal tube in good position. Silhouetting of the left hemidiaphragm.  MICROBIOLOGY: Blood Ctx x2 4/8>>>  ANTIBIOTICS: Rocephin 4/8>>>  SIGNIFICANT EVENTS: 4/8 - Presented to  ED & intubated by JN  LINES/TUBES: OETT 7.5 4/8>>> Foley 4/8>>> PIV x2  ASSESSMENT / PLAN:  PULMONARY A: Acute Respiratory Failure - Unable to protect airway.  P:   Full Vent Support ABG 1 hour post intubation  CARDIOVASCULAR A:  Shock - Resolved w/ fluids. Tachycardia  P:  Monitor on telemetry Vitals per unit protocol Trending Troponin I q6hr  RENAL A:   Possible Acute Renal Failure Uremia Metabolic Acidosis Hyponatremia - Mild.  P:   Nephrology Consult Continue fluid resuscitation Trending UOP with Foley Monitoring renal function & electrolytes daily Checking Lactic Acid  GASTROINTESTINAL A:   Hematemesis - Upper GIB from Ulcer vs Varices. Chronic EtOH Use  P:   GI Consulted Protonix gtt Octreotide gtt NPO Holding off on OGT/NGT Complete Abdominal U/S  HEMATOLOGIC A:   Anemia - Likely secondary to GIB. Coagulopathy  P:  Stat CBC  Transfusing 1u PRBC & 1u FFP Repeat Coags & hgb/hct post transfusion Vitamin K  IV now DDAVP IV for uremic bleeding Trending Hgb/Hct q4hr & Coags daily  INFECTIOUS A:   No acute issues.  P:   Monitor for new signs/symptoms of infection. Empiric Rocephin IV daily for SBP prophylaxis  ENDOCRINE A:   Hyperglycemia - No h/o DM.  P:   Accu-Checks q4hr SSI per low dose algorithm Checking Hgb A1c  NEUROLOGIC A:   Acute Encephalopathy - Possibly Hepatic Encephalopathy vs EtOH Withdrawal. Sedation on Ventilator Chronic EtOH Use  P:   Goal RASS:  0 to -1 Versed IV prn Fentanyl IV prn Thiamine & FA IV daily Holding on Lactulose for now  FAMILY  - Updates: No family at bedside. Called the prison to update medical staff [phone].  - Inter-disciplinary family meet or Palliative Care meeting due by:  4/14  TODAY'S SUMMARY:  55 year old Spanish male currently incarcerated with known history of alcohol use. Presenting with hematemesis. Now with altered mental status requiring endotracheal  intubation. GI following. Continuing treatment for peptic ulcer disease versus variceal bleeding. Consult nephrology for evaluation and further recommendations on treatment of renal failure. Correcting coagulopathy.  I have spent a total of 47 minutes of critical care time today independent time during procedures caring for the patient, reviewing the patient's electronic medical record, and contacting the [place] prison system to obtain further records.   Donna Christen Jamison Neighbor, M.D. [facility] Pager:  [phone] After 3pm or if no response, call [phone] , 11:07 AM

## 2015-05-09 NOTE — Progress Notes (Signed)
   [facility] PCCM  Pt was seen by renal service and plan was for cvvh.  Pt has received 1 uprBC and on going 1 u pRBC.  plt count after FFP was 25. INR was 2.38, Received 5 mg IV Vit K earlier.  Pt at increased risk of bleeding 2/2 AKI and liver issues Plan: 1. Transfuse 4 u FFP. 2. Transfuse 1 plt pack 3. Give vitamin K agin. 4. Rpt labs pot transfusion.  5. Plan for HD cath after transfusion.   Pollie MeyerJ. Angelo A de Dios, MD 05/05/2015, 6:13 PM [facility] Pager [phone] After 3 pm or if no answer, call [phone]

## 2015-05-09 NOTE — Progress Notes (Signed)
Called Dr Arsenio LoaderSommer and informed him about bp being hypotensive and most recent ABG ph 7.076 and  hco3 of 6.6.  See orders.  Erick Blinksuchman, Kenlyn Lose D, RN

## 2015-05-09 NOTE — ED Notes (Signed)
Per EMS pt escorted with sheriff from jail with intermittent nosebleed for 2 days; reports hx ETOH abuse and unknown normal a/o status per staff at jail. Pt hypotensive on arrival moaning with dried blood around mouth. Pt is spanish speaking with spanish speaking sheriff at bedside upon EMS report. Medical staff retrieving medical translator at present time. Primary nurse at bedside attempting blood draw and administering fluids at present time. Unable to collect complete screening and suicide risk related to language barrier. Zackowski aware of pt status and complaint.

## 2015-05-11 LAB — PREPARE FRESH FROZEN PLASMA: UNIT DIVISION: 0

## 2015-05-11 LAB — HEMOGLOBIN A1C
Hgb A1c MFr Bld: 6 % — ABNORMAL HIGH (ref 4.8–5.6)
Mean Plasma Glucose: 126 mg/dL

## 2015-05-12 LAB — CULTURE, RESPIRATORY W GRAM STAIN

## 2015-05-12 LAB — CULTURE, RESPIRATORY

## 2015-05-13 LAB — TYPE AND SCREEN
ABO/RH(D): O POS
Antibody Screen: POSITIVE
DAT, IGG: NEGATIVE
DONOR AG TYPE: NEGATIVE
Donor AG Type: NEGATIVE
PT AG TYPE: NEGATIVE
UNIT DIVISION: 0
Unit division: 0

## 2015-05-13 LAB — CULTURE, BLOOD (ROUTINE X 2)

## 2015-05-15 LAB — CULTURE, BLOOD (ROUTINE X 2): CULTURE: NO GROWTH

## 2015-06-01 NOTE — Progress Notes (Signed)
Notifed Tamala Bariim McNeal, M.E. Regarding pt status.  M.E. Will follow-up.

## 2015-06-01 NOTE — Progress Notes (Signed)
S: Called by [facility] to evaluate patient as was having worsening acidosis and shock. Concern for need for dialysis given acidosis.  O: Found patient to be in respiratory distress despite invasive mechanical ventilation, overbreathing vent significantly. Severe jaundice, blood oozing from mouth. Review of labs showed worsening MSOF. Discussed with jail, believed impending death.  A/P: Per other note, believe death is imminent, and elect to continue current level of care, without escalation. Jail MD concurs and staff do not object.  CRITICAL CARE Performed by: Jamie KatoRIMBLE, Mairely Foxworth   Total critical care time: 30 minutes  Critical care time was exclusive of separately billable procedures and treating other patients.  Critical care was necessary to treat or prevent imminent or life-threatening deterioration.  Critical care was time spent personally by me on the following activities: development of treatment plan with patient and/or surrogate as well as nursing, discussions with consultants, evaluation of patient's response to treatment, examination of patient, obtaining history from patient or surrogate, ordering and performing treatments and interventions, ordering and review of laboratory studies, ordering and review of radiographic studies, pulse oximetry and re-evaluation of patient's condition.

## 2015-06-01 NOTE — Discharge Summary (Addendum)
Death Note: For a complete accounting of the patient's history and physical exam on presentation please refer to the admission H&P dictated by me on 4/8 at 12:34 PM. In brief patient was transferred to the emergency department here for hematemesis. Patient was found to be hypotensive on arrival. With fluid resuscitation patient's hypotension resolved but he developed worsening mental status and tachycardia. At the time of my assessment patient was completely encephalopathic and unable to protect his airway. Gastroenterology assess the patient and recommended treatment for possible variceal hemorrhage/upper gastrointestinal bleeding. Patient was intubated in the emergency department secondary to inability to protect his airway. He progressively became more hypotensive and with worsening multisystem organ failure the on-call physician discussed his case with Dr. Jenean Lindauhodes ([facility]) & Lieut. [facility]. The decision was made that the patient would be DO NOT RESUSCITATE/DO NOT INTUBATE without further escalation of care as his condition was rapidly deteriorating and he was unlikely to survive. Per documentation patient was pronounced deceased with no spontaneous heart sounds at 22:32 PM on 05/22/2015. Officer was at bedside and aware of passing. There is no next of kin on file at our facility.  Diagnoses at Death: 1. Septic Shock with Multi-System Organ Failure 2. Lactic Acidosis 3. Coagulopathy 4. Acute Renal Failure 5. Acute Hypoxic Respiratory Failure 6. Acute Encephalopathy 7. Urinary Tract Infection 8. Fungemia 9. 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 Chronic Alcohol Use

## 2015-06-01 DEATH — deceased

## 2017-05-19 IMAGING — DX DG CHEST 1V PORT
1 series · 1 of 1 positions shown · non-contrast
Comparison: X-ray from earlier today

CLINICAL DATA: Status post intubation.  Alcohol abuse.

EXAM:
PORTABLE CHEST 1 VIEW

[chest ap]
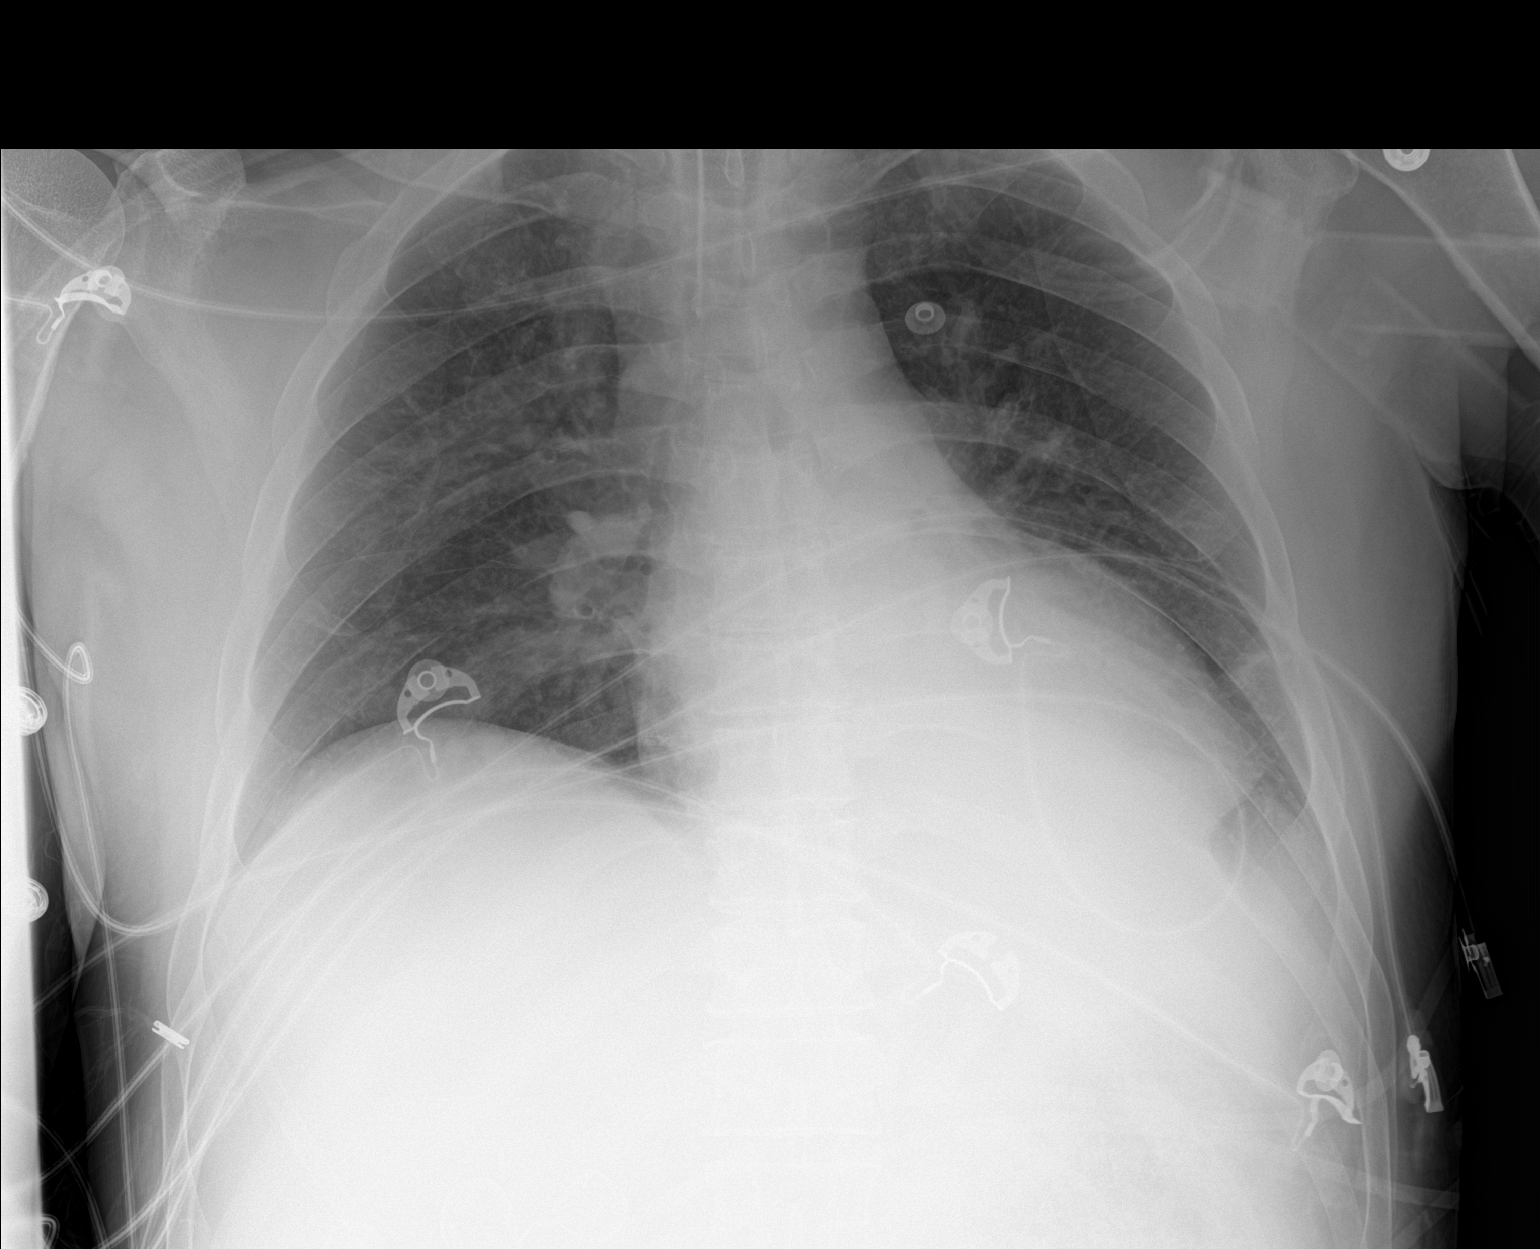

[1 of 1 positions shown; findings below may reference images not displayed]

FINDINGS: An ET tube has been placed in the interval, terminating in good
position. No pneumothorax. The right lung is clear. The
cardiomediastinal silhouette is stable. The left hemidiaphragm is
not well visualized and a left retrocardiac opacity is not excluded
on this study. A PA and lateral chest x-ray could better evaluate.
IMPRESSION: Obscuration of the left hemidiaphragm could represent a retrocardiac
opacity. A PA and lateral chest x-ray could better evaluate.

The ETT is in good position.
